# Patient Record
Sex: Male | Born: 1965 | Race: White | Hispanic: No | Marital: Single | Smoking: Never smoker
Health system: Southern US, Community
[De-identification: ages and names within clinical notes are randomized; demographics above are authoritative.]

## PROBLEM LIST (undated history)

## (undated) DIAGNOSIS — F419 Anxiety disorder, unspecified: Secondary | ICD-10-CM

## (undated) DIAGNOSIS — F410 Panic disorder [episodic paroxysmal anxiety] without agoraphobia: Secondary | ICD-10-CM

## (undated) DIAGNOSIS — Z9889 Other specified postprocedural states: Secondary | ICD-10-CM

## (undated) DIAGNOSIS — G4710 Hypersomnia, unspecified: Secondary | ICD-10-CM

## (undated) HISTORY — DX: Hypersomnia, unspecified: G47.10

## (undated) HISTORY — PX: CHOLECYSTECTOMY: SHX55

---

## 1998-05-04 ENCOUNTER — Emergency Department (HOSPITAL_COMMUNITY): Admission: EM | Admit: 1998-05-04 | Discharge: 1998-05-04 | Payer: Self-pay | Admitting: Emergency Medicine

## 1998-08-12 ENCOUNTER — Emergency Department (HOSPITAL_COMMUNITY): Admission: EM | Admit: 1998-08-12 | Discharge: 1998-08-12 | Payer: Self-pay | Admitting: Emergency Medicine

## 1998-08-12 ENCOUNTER — Encounter: Payer: Self-pay | Admitting: Emergency Medicine

## 1998-08-13 ENCOUNTER — Encounter: Payer: Self-pay | Admitting: Emergency Medicine

## 1998-08-15 ENCOUNTER — Emergency Department (HOSPITAL_COMMUNITY): Admission: EM | Admit: 1998-08-15 | Discharge: 1998-08-15 | Payer: Self-pay | Admitting: Emergency Medicine

## 1998-08-31 ENCOUNTER — Encounter: Payer: Self-pay | Admitting: Emergency Medicine

## 1998-08-31 ENCOUNTER — Emergency Department (HOSPITAL_COMMUNITY): Admission: EM | Admit: 1998-08-31 | Discharge: 1998-08-31 | Payer: Self-pay | Admitting: Emergency Medicine

## 1998-09-10 ENCOUNTER — Emergency Department (HOSPITAL_COMMUNITY): Admission: EM | Admit: 1998-09-10 | Discharge: 1998-09-10 | Payer: Self-pay | Admitting: Emergency Medicine

## 1998-09-13 ENCOUNTER — Emergency Department (HOSPITAL_COMMUNITY): Admission: EM | Admit: 1998-09-13 | Discharge: 1998-09-13 | Payer: Self-pay | Admitting: Emergency Medicine

## 1998-09-21 ENCOUNTER — Encounter: Payer: Self-pay | Admitting: Emergency Medicine

## 1998-09-21 ENCOUNTER — Emergency Department (HOSPITAL_COMMUNITY): Admission: EM | Admit: 1998-09-21 | Discharge: 1998-09-21 | Payer: Self-pay | Admitting: Emergency Medicine

## 1998-09-24 ENCOUNTER — Emergency Department (HOSPITAL_COMMUNITY): Admission: EM | Admit: 1998-09-24 | Discharge: 1998-09-25 | Payer: Self-pay | Admitting: Emergency Medicine

## 1998-10-01 ENCOUNTER — Inpatient Hospital Stay (HOSPITAL_COMMUNITY): Admission: EM | Admit: 1998-10-01 | Discharge: 1998-10-04 | Payer: Self-pay | Admitting: Cardiovascular Disease

## 1998-10-26 ENCOUNTER — Emergency Department (HOSPITAL_COMMUNITY): Admission: EM | Admit: 1998-10-26 | Discharge: 1998-10-26 | Payer: Self-pay | Admitting: Emergency Medicine

## 1998-10-26 ENCOUNTER — Encounter: Payer: Self-pay | Admitting: Emergency Medicine

## 1998-11-27 ENCOUNTER — Emergency Department (HOSPITAL_COMMUNITY): Admission: EM | Admit: 1998-11-27 | Discharge: 1998-11-27 | Payer: Self-pay | Admitting: Emergency Medicine

## 1998-12-03 ENCOUNTER — Emergency Department (HOSPITAL_COMMUNITY): Admission: EM | Admit: 1998-12-03 | Discharge: 1998-12-03 | Payer: Self-pay | Admitting: Emergency Medicine

## 2000-04-24 ENCOUNTER — Emergency Department (HOSPITAL_COMMUNITY): Admission: EM | Admit: 2000-04-24 | Discharge: 2000-04-24 | Payer: Self-pay | Admitting: Emergency Medicine

## 2002-09-04 ENCOUNTER — Encounter: Payer: Self-pay | Admitting: Family Medicine

## 2002-09-04 ENCOUNTER — Ambulatory Visit (HOSPITAL_COMMUNITY): Admission: RE | Admit: 2002-09-04 | Discharge: 2002-09-04 | Payer: Self-pay | Admitting: Family Medicine

## 2003-08-15 ENCOUNTER — Emergency Department (HOSPITAL_COMMUNITY): Admission: EM | Admit: 2003-08-15 | Discharge: 2003-08-16 | Payer: Self-pay | Admitting: Emergency Medicine

## 2009-08-26 ENCOUNTER — Emergency Department (HOSPITAL_COMMUNITY): Admission: EM | Admit: 2009-08-26 | Discharge: 2009-08-27 | Payer: Self-pay | Admitting: Emergency Medicine

## 2009-09-02 ENCOUNTER — Ambulatory Visit (HOSPITAL_COMMUNITY): Admission: RE | Admit: 2009-09-02 | Discharge: 2009-09-02 | Payer: Self-pay | Admitting: Cardiology

## 2010-01-27 ENCOUNTER — Encounter: Admission: RE | Admit: 2010-01-27 | Discharge: 2010-01-27 | Payer: Self-pay | Admitting: Family Medicine

## 2010-10-26 ENCOUNTER — Emergency Department (HOSPITAL_COMMUNITY)
Admission: EM | Admit: 2010-10-26 | Discharge: 2010-10-26 | Disposition: A | Discharge status: Home / Self Care | Payer: Self-pay | Attending: Emergency Medicine | Admitting: Emergency Medicine

## 2010-10-26 DIAGNOSIS — H5710 Ocular pain, unspecified eye: Secondary | ICD-10-CM | POA: Insufficient documentation

## 2010-10-26 DIAGNOSIS — H16139 Photokeratitis, unspecified eye: Secondary | ICD-10-CM | POA: Insufficient documentation

## 2010-12-26 LAB — POCT CARDIAC MARKERS
CKMB, poc: <1 ng/mL — ABNORMAL LOW (ref 1.0–8.0)
CKMB, poc: <1 ng/mL — ABNORMAL LOW (ref 1.0–8.0)
Myoglobin, poc: 53.3 ng/mL (ref 12–200)
Troponin i, poc: <0.05 ng/mL (ref 0.00–0.09)

## 2010-12-26 LAB — BASIC METABOLIC PANEL
Calcium: 9 mg/dL (ref 8.4–10.5)
GFR calc non Af Amer: >60 mL/min (ref >60)
Potassium: 3.6 mEq/L (ref 3.5–5.1)
Sodium: 140 mEq/L (ref 135–145)

## 2010-12-26 LAB — CBC
HCT: 42.9 % (ref 39.0–52.0)
Hemoglobin: 14.7 g/dL (ref 13.0–17.0)
Platelets: 218 10*3/uL (ref 150–400)
WBC: 5.9 10*3/uL (ref 4.0–10.5)

## 2012-12-09 ENCOUNTER — Other Ambulatory Visit: Payer: Self-pay

## 2012-12-09 ENCOUNTER — Emergency Department (HOSPITAL_COMMUNITY)
Admission: EM | Admit: 2012-12-09 | Discharge: 2012-12-09 | Disposition: A | Discharge status: Home / Self Care | Payer: BC Managed Care – PPO | Attending: Emergency Medicine | Admitting: Emergency Medicine

## 2012-12-09 ENCOUNTER — Encounter (HOSPITAL_COMMUNITY): Payer: Self-pay | Admitting: Emergency Medicine

## 2012-12-09 ENCOUNTER — Emergency Department (HOSPITAL_COMMUNITY): Payer: BC Managed Care – PPO

## 2012-12-09 DIAGNOSIS — F419 Anxiety disorder, unspecified: Secondary | ICD-10-CM

## 2012-12-09 DIAGNOSIS — Z79899 Other long term (current) drug therapy: Secondary | ICD-10-CM | POA: Insufficient documentation

## 2012-12-09 DIAGNOSIS — F411 Generalized anxiety disorder: Secondary | ICD-10-CM | POA: Insufficient documentation

## 2012-12-09 DIAGNOSIS — I252 Old myocardial infarction: Secondary | ICD-10-CM | POA: Insufficient documentation

## 2012-12-09 DIAGNOSIS — Z9861 Coronary angioplasty status: Secondary | ICD-10-CM | POA: Insufficient documentation

## 2012-12-09 DIAGNOSIS — R079 Chest pain, unspecified: Secondary | ICD-10-CM

## 2012-12-09 DIAGNOSIS — R110 Nausea: Secondary | ICD-10-CM | POA: Insufficient documentation

## 2012-12-09 DIAGNOSIS — Z951 Presence of aortocoronary bypass graft: Secondary | ICD-10-CM | POA: Insufficient documentation

## 2012-12-09 DIAGNOSIS — R0602 Shortness of breath: Secondary | ICD-10-CM | POA: Insufficient documentation

## 2012-12-09 HISTORY — DX: Panic disorder (episodic paroxysmal anxiety): F41.0

## 2012-12-09 HISTORY — DX: Anxiety disorder, unspecified: F41.9

## 2012-12-09 HISTORY — DX: Other specified postprocedural states: Z98.890

## 2012-12-09 LAB — CBC WITH DIFFERENTIAL/PLATELET
Basophils Absolute: 0 10*3/uL (ref 0.0–0.1)
Eosinophils Absolute: 0.2 10*3/uL (ref 0.0–0.7)
Lymphs Abs: 1.2 10*3/uL (ref 0.7–4.0)
MCH: 29.5 pg (ref 26.0–34.0)
Neutrophils Relative %: 75 % (ref 43–77)
Platelets: 224 10*3/uL (ref 150–400)
RBC: 5.36 MIL/uL (ref 4.22–5.81)
RDW: 13.5 % (ref 11.5–15.5)
WBC: 7.5 10*3/uL (ref 4.0–10.5)

## 2012-12-09 LAB — BASIC METABOLIC PANEL
Calcium: 9.8 mg/dL (ref 8.4–10.5)
GFR calc Af Amer: >90 mL/min (ref >90)
GFR calc non Af Amer: >90 mL/min (ref >90)
Glucose, Bld: 114 mg/dL — ABNORMAL HIGH (ref 70–99)
Potassium: 3.8 mEq/L (ref 3.5–5.1)
Sodium: 140 mEq/L (ref 135–145)

## 2012-12-09 LAB — TROPONIN I: Troponin I: <0.3 ng/mL (ref <0.30)

## 2012-12-09 LAB — HEPATIC FUNCTION PANEL
AST: 19 U/L (ref 0–37)
Bilirubin, Direct: 0.1 mg/dL (ref 0.0–0.3)
Indirect Bilirubin: 0.4 mg/dL (ref 0.3–0.9)
Total Bilirubin: 0.5 mg/dL (ref 0.3–1.2)

## 2012-12-09 LAB — LIPASE, BLOOD: Lipase: 37 U/L (ref 11–59)

## 2012-12-09 LAB — D-DIMER, QUANTITATIVE: D-Dimer, Quant: <0.27 ug/mL-FEU (ref 0.00–0.48)

## 2012-12-09 MED ORDER — ONDANSETRON HCL 4 MG/2ML IJ SOLN
4.0000 mg | Freq: Once | INTRAMUSCULAR | Status: AC
  Administered 2012-12-09: 4 mg via INTRAVENOUS
  Filled 2012-12-09: qty 2

## 2012-12-09 NOTE — ED Notes (Signed)
Pt states that 45 minutes ago he woke up with difficulty breathing and chest pain/pressure. States that he also feels light headed and dizzy. Pt also notes a history of panic attacks but is not sure if this is the same thing.

## 2012-12-09 NOTE — ED Provider Notes (Signed)
History     CSN: [id number]  Arrival date & time 12/09/12  0423   First MD Initiated Contact with Patient 12/09/12 [phone]      Chief Complaint  Patient presents with  . Shortness of Breath  . Chest Pain    (Consider location/radiation/quality/duration/timing/severity/associated sxs/prior treatment) HPI Comments: Pt has hx of anxiety and panic attacks - has had 2 normal heart cath's first in 2000, then second in 2010, neither of which he reports to show any blockages.  He has no htn, no dm, no cholesterolemia and no smoking / etoh use.  He does have brother with MI at early age requiring CABG.  The pt was awakened from bed this evening with feeling of panic and anxiety - CP, SOB and nausea with dry heaving.  He reports being off his regular anxiety meds over the last month - taking paroxetine and Klonopin as needed only.  He still has mild sx at this time including nausea but improving.  He denies fevers, cough, back pain, swelling or rashes.  No ha, blurred vision.  He has had post prandial discomfort in his upper abd for some time.  He had GB surgery in the past and no hx of pancreatitis.  He also notes that he works long distances away and drives 5+ hours frequently.  When he was having onset of sx tonight - he reports high HR in the 120's.  Patient is a 47 y.o. male presenting with shortness of breath and chest pain. The history is provided by the patient.  Shortness of Breath Associated symptoms: chest pain   Chest Pain Associated symptoms: shortness of breath     Past Medical History  Diagnosis Date  . Anxiety   . Panic attacks   . Hx of cardiac cath     x2, no stents     Past Surgical History  Procedure Laterality Date  . Cholecystectomy      History reviewed. No pertinent family history.  History  Substance Use Topics  . Smoking status: Never Smoker   . Smokeless tobacco: Not on file  . Alcohol Use: No      Review of Systems  Respiratory: Positive for shortness  of breath.   Cardiovascular: Positive for chest pain.  All other systems reviewed and are negative.    Allergies  Review of patient's allergies indicates no known allergies.  Home Medications   Current Outpatient Rx  Name  Route  Sig  Dispense  Refill  . clonazePAM (KLONOPIN) 1 MG tablet   Oral   Take 1 mg by mouth 2 (two) times daily as needed for anxiety.         Marland Kitchen PARoxetine (PAXIL) 20 MG tablet   Oral   Take 40 mg by mouth every morning.           BP 127/63  Pulse 101  Temp(Src) 98 F (36.7 C) (Oral)  Resp 22  SpO2 99%  Physical Exam  Nursing note and vitals reviewed. Constitutional: He appears well-developed and well-nourished. No distress.  HENT:  Head: Normocephalic and atraumatic.  Mouth/Throat: Oropharynx is clear and moist. No oropharyngeal exudate.  Eyes: Conjunctivae and EOM are normal. Pupils are equal, round, and reactive to light. Right eye exhibits no discharge. Left eye exhibits no discharge. No scleral icterus.  Neck: Normal range of motion. Neck supple. No JVD present. No thyromegaly present.  Cardiovascular: Normal rate, regular rhythm, normal heart sounds and intact distal pulses.  Exam reveals no gallop and  no friction rub.   No murmur heard. Pulmonary/Chest: Effort normal and breath sounds normal. No respiratory distress. He has no wheezes. He has no rales.  Abdominal: Soft. Bowel sounds are normal. He exhibits no distension and no mass. There is no tenderness.  Musculoskeletal: Normal range of motion. He exhibits no edema and no tenderness.  Lymphadenopathy:    He has no cervical adenopathy.  Neurological: He is alert. Coordination normal.  Skin: Skin is warm and dry. No rash noted. No erythema.  Psychiatric: He has a normal mood and affect. His behavior is normal.    ED Course  Procedures (including critical care time)  Labs Reviewed  BASIC METABOLIC PANEL - Abnormal; Notable for the following:    Glucose, Bld 114 (*)    All other  components within normal limits  CBC WITH DIFFERENTIAL  TROPONIN I  D-DIMER, QUANTITATIVE  LIPASE, BLOOD  HEPATIC FUNCTION PANEL   Dg Chest 2 View  12/09/2012  *RADIOLOGY REPORT*  Clinical Data: Chest pain and shortness of breath.  CHEST - 2 VIEW  Comparison: 08/26/2009  Findings: The heart size and pulmonary vascularity are normal. The lungs appear clear and expanded without focal air space disease or consolidation. No blunting of the costophrenic angles. Pneumothorax.  Mediastinal contours appear intact.  No significant change since previous study.  IMPRESSION: No evidence of active pulmonary disease.   Original Report Authenticated By: Burman Nieves, M.D.      1. Chest pain   2. Shortness of breath   3. Anxiety       MDM  On my exam , the pt has no signs of DVT - he does have a HR which is > 90 and travels - will obtain d dimer to evaluate for PE, otherwise he is very low risk for ACS, ECG non ischemic and he has lipase and LFT's pending.  Declines pain meds, requests nausea meds.  ED ECG REPORT  I personally interpreted this EKG   Date: 12/09/2012   Rate: 99  Rhythm: normal sinus rhythm  QRS Axis: normal  Intervals: normal  ST/T Wave abnormalities: normal  Conduction Disutrbances:none  Narrative Interpretation:   Old EKG Reviewed: none available  Labs and xray normal - no signs of cardiac or pulmonary disease clinically or on testing.  Pt appears stable for f/u as outpatient - recommended daily meds especially with Paxil.  Pt in agreement as he now states he has been having more anxiety attacks lately.       Vida Roller, MD 12/09/12 [phone]

## 2013-08-17 ENCOUNTER — Encounter (INDEPENDENT_AMBULATORY_CARE_PROVIDER_SITE_OTHER): Payer: Self-pay

## 2013-08-17 ENCOUNTER — Ambulatory Visit (INDEPENDENT_AMBULATORY_CARE_PROVIDER_SITE_OTHER): Payer: BC Managed Care – PPO | Admitting: Neurology

## 2013-08-17 ENCOUNTER — Telehealth: Payer: Self-pay | Admitting: Neurology

## 2013-08-17 ENCOUNTER — Encounter: Payer: Self-pay | Admitting: Neurology

## 2013-08-17 VITALS — BP 127/83 | HR 67 | Resp 16 | Ht 69.0 in | Wt 240.0 lb

## 2013-08-17 DIAGNOSIS — G4726 Circadian rhythm sleep disorder, shift work type: Secondary | ICD-10-CM

## 2013-08-17 DIAGNOSIS — R0609 Other forms of dyspnea: Secondary | ICD-10-CM

## 2013-08-17 DIAGNOSIS — R0683 Snoring: Secondary | ICD-10-CM

## 2013-08-17 DIAGNOSIS — G2581 Restless legs syndrome: Secondary | ICD-10-CM

## 2013-08-17 DIAGNOSIS — G4710 Hypersomnia, unspecified: Secondary | ICD-10-CM

## 2013-08-17 DIAGNOSIS — G4759 Other parasomnia: Secondary | ICD-10-CM

## 2013-08-17 DIAGNOSIS — G4752 REM sleep behavior disorder: Secondary | ICD-10-CM

## 2013-08-17 MED ORDER — ARMODAFINIL 150 MG PO TABS: 150.0000 mg | ORAL_TABLET | Freq: Every day | ORAL | Status: DC

## 2013-08-17 NOTE — Telephone Encounter (Signed)
Pt has decided to wait until the beginning of the year before scheduling his sleep study.  He has not applied anything towards his deductible for the current year so he has chosen to wait until his new benefit period as he may potentially have new insurance.  He will contact our office at that time to follow up.

## 2013-08-17 NOTE — Progress Notes (Signed)
[facility]  Provider:  Melvyn Novas, M D  Referring Provider: Alice Reichert, MD Primary Care Physician:  Alice Reichert, MD  Chief Complaint  Patient presents with  . Sleep Consult    NP Virgina [place]    HPI:  Aaron Rosario is a 47 y.o. caucasian , right handed  male  is seen here as a referral   from NP Ronnell Freshwater at Dr. Rocky Morel Kinney's office.    Aaron Rosario , [name], right-handed male, has reported to his NP over the last several visits that he has  extreme daytime Sleepiness.  As an example he today endorsed the Epworth sleepiness score at 20 points , his  fatigue severity score is elevated  at 34 points.  He states that he has rarely trouble falling asleep,  sometimes he may have trouble staying asleep at night. Insomnia is not one of his main concerns.  A past medical history of only gastroesophageal reflux was mentioned,  and a significant weight gain over the last 5 years was noted . He has been hypersomnic for 2-3 years now. There was no related medication changes and no medical diagnosis or trauma at the time.   He works rather irregular hours, is gainfully employed.  He Psychologist, sport and exercise for [employer]  He would routinely work 8 AM  to 5 PM but his work is often dependent on his" client's' needs and may take him well  into the night. He is off work on weekends.   Normal sleep time routine  is endorsed as a bed time at 10-11 PM , he  falls asleep promptly, he  rises at 6.30 "to let the dogs out"  and goes on that occasion to the bathroom break, he will go back to sleep, and sleep until 8.30 to 9.30. On weekends it may be Noon time . He will rarely drink coffee.  He will get over 7 hours of sleep but wakes up un-refreshed, spontaneously , he has an alarm for aback up. He takes nightly clonazepam and if not will wake up "startled and choked". He is known to sleep talk and tosses and turns a lot.  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  He will not nap but he fights sleep all through the day.  He seeks, needs stimulation, and thus he may drive with the window down .  If he is a passenger , he will fall asleep every time .  No naps once home. He will do his chores, and while watching TV will sometimes fall asleep. Interestingly he experiences  the urge to sleep much less at home when relaxed and able to sleep.      Family history of sleep disorders  is not known. No OSA, seizures, night terrors. Insomnia.  No personal  history of trauma or surgery to skull, neck and face , airway.    Review of Systems: Out of a complete 14 system review, the patient complains of only the following symptoms, and all other reviewed systems are negative:   the patient endorsed weight gain and tendency to bruise easily. fatigue, snoring, joint pain muscle cramps and muscle aching He also feels at times generalized weak and  dizzy -  restless legs and snoring, memory loss or at least concentration problems, anxiety and a feeling of decreased energy in spite of prolonged sleep.  His history of anxiety with some  panic attacks .    Only surgery was a cholecystectomy in 2000.    History   Social History  . Marital Status: Single    Spouse Name: N/A    Number of Children: 0  . Years of Education: 13   Occupational History  .      [employer] officer   Social History Main Topics  . Smoking status: Never Smoker   . Smokeless tobacco: Not on file  . Alcohol Use: No     Comment: Caffeine user 2-3 sodas a day  . Drug Use: No  . Sexual Activity: Not on file   Other Topics Concern  . Not on file   Social History Narrative  . No narrative on file    History reviewed. No pertinent family history.  Past Medical History  Diagnosis Date  . Anxiety   . Panic attacks   . Hx of cardiac cath     x2, no stents   . Hypersomnia, persistent     Past Surgical History   Procedure Laterality Date  . Cholecystectomy      Current Outpatient Prescriptions  Medication Sig Dispense Refill  . clonazePAM (KLONOPIN) 1 MG tablet Take 1 mg by mouth 2 (two) times daily as needed for anxiety.      Marland Kitchen PARoxetine (PAXIL) 20 MG tablet Take 40 mg by mouth every morning.       No current facility-administered medications for this visit.    Allergies as of 08/17/2013  . (No Known Allergies)    Vitals: BP 127/83  Pulse 67  Resp 16  Ht 5\' 9"  (1.753 m)  Wt 240 lb (108.863 kg)  BMI 35.43 kg/m2 Last Weight:  Wt Readings from Last 1 Encounters:  08/17/13 240 lb (108.863 kg)   Last Height:   Ht Readings from Last 1 Encounters:  08/17/13 5\' 9"  (1.753 m)    Physical exam:  General: The patient is awake, alert and appears not in acute distress. The patient is well groomed. Head: Normocephalic, atraumatic. Neck is supple. Mallampati 4, neck circumference:16.5  Cardiovascular:  Regular rate and rhythm , without  murmurs or carotid bruit, and without distended neck veins. Respiratory: Lungs are clear to auscultation. Skin:  Without evidence of edema, or rash, joint pain reported. Trunk: BMI is  elevated and patient  has normal posture.  Neurologic exam : The patient is awake and alert, oriented to place and time.  Memory subjective  described as intact. There is a restricted attention span & concentration ability when tired.  Speech is fluent without  dysarthria, dysphonia or aphasia. Mood and affect are appropriate.  Cranial nerves: Pupils are equal and briskly reactive to light. Funduscopic exam without  evidence of pallor or edema. Extraocular movements  in vertical and horizontal planes intact and without nystagmus. Visual fields by finger perimetry are intact. Hearing to finger rub intact.  Facial sensation intact to fine touch. Facial motor strength is symmetric and tongue and uvula move midline.  Motor exam:   Normal tone and normal muscle bulk and  symmetric normal strength in all extremities. He is fidgety.   Sensory:  Fine touch, pinprick and vibration were tested in all extremities. Proprioception is tested in the upper extremities only. This was normal.  Coordination: Rapid alternating movements in the fingers/hands is tested and normal.  Finger-to-nose maneuver tested and normal without evidence of ataxia, dysmetria or tremor.  Gait and station: Patient walks without assistive device .  Strength within normal limits. Stance  is stable and normal. Tandem gait is unfragmented. Romberg testing is  normal.  Deep tendon reflexes: in the  upper and lower extremities are symmetric and intact. Babinski maneuver response is  downgoing.   Assessment:  After physical and neurologic examination, review of laboratory studies, imaging, neurophysiology testing and pre-existing records, assessment is  0) EDS with Epworth of 20 and FSS of 34.  Reports neither sleep paralysis, nor cataplectic attacks. No dream intrusion.  1) restlessness, twitching and fidgety . Mostly when trying to fall asleep.  2) snoring, witnessed as loud with choking interruptions .  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  Plan:  Treatment plan and additional workup : SPLIT study for apnea work up, based on medications take by this patient I would like an HLA narcolepsy test rather than MSLT (medication  weaning process would be prolonged for meaningful, valid MSLT). Patient will  take Klonopin here in the sleep lab only if he wakes up, not to initiate sleep if avoidable.   Recommendations for referring provider.  Consider weaning off Paxil and using Neurontin for RLS.  Wellbutrin may help with morning fatigue.

## 2013-08-17 NOTE — Patient Instructions (Signed)
Polysomnography (Sleep Studies) Polysomnography (PSG) is a series of tests used for detecting (diagnosing) obstructive sleep apnea and other sleep disorders. The tests measure how some parts of your body are working while you are sleeping. The tests are extensive and expensive. They are done in a sleep lab or hospital, and vary from center to center. Your caregiver may perform other more simple sleep studies and questionnaires before doing more complete and involved testing. Testing may not be covered by insurance. Some of these tests are:  An EEG (Electroencephalogram). This tests your brain waves and stages of sleep.  An EOG (Electrooculogram). This measures the movements of your eyes. It detects periods of REM (rapid eye movement) sleep, which is your dream sleep.  An EKG (Electrocardiogram). This measures your heart rhythm.  EMG (Electromyography). This is a measurement of how the muscles are working in your upper airway and your legs while sleeping.  An oximetry measurement. It measures how much oxygen (air) you are getting while sleeping.  Breathing efforts may be measured. The same test can be interpreted (understood) differently by different caregivers and centers that study sleep.  Studies may be given an apnea/hypopnea index (AHI). This is a number which is found by counting the times of no breathing or under breathing during the night, and relating those numbers to the amount of time spent in bed. When the AHI is greater than 15, the patient is likely to complain of daytime sleepiness. When the AHI is greater than 30, the patient is at increased risk for heart problems and must be followed more closely. Following the AHI also allows you to know how treatment is working. Simple oximetry (tracking the amount of oxygen that is taken in) can be used for screening patients who:  Do not have symptoms (problems) of OSA.  Have a normal Epworth Sleepiness Scale Score.  Have a low pre-test  probability of having OSA.  Have none of the upper airway problems likely to cause apnea.  Oximetry is also used to determine if treatment is effective in patients who showed significant desaturations (not getting enough oxygen) on their home sleep study. One extra measure of safety is to perform additional studies for the person who only snores. This is because no one can predict with absolute certainty who will have OSA. Those who show significant desaturations (not getting enough oxygen) are recommended to have a more detailed sleep study. Document Released: 03/17/2003 Document Revised: 12/03/2011 Document Reviewed: 09/10/2005 [facility] Patient Information 2014 [place], [place]. Exercise to Lose Weight Exercise and a healthy diet may help you lose weight. Your doctor may suggest specific exercises. EXERCISE IDEAS AND TIPS  Choose low-cost things you enjoy doing, such as walking, bicycling, or exercising to workout videos.  Take stairs instead of the elevator.  Walk during your lunch break.  Park your car further away from work or school.  Go to a gym or an exercise class.  Start with 5 to 10 minutes of exercise each day. Build up to 30 minutes of exercise 4 to 6 days a week.  Wear shoes with good support and comfortable clothes.  Stretch before and after working out.  Work out until you breathe harder and your heart beats faster.  Drink extra water when you exercise.  Do not do so much that you hurt yourself, feel dizzy, or get very short of breath. Exercises that burn about 150 calories:  Running 1  miles in 15 minutes.  Playing volleyball for 45 to 60 minutes.  Washing and waxing a car for 45 to 60 minutes.  Playing touch football for 45 minutes.  Walking 1  miles in 35 minutes.  Pushing a stroller 1  miles in 30 minutes.  Playing basketball for 30 minutes.  Raking leaves for 30 minutes.  Bicycling 5 miles in 30 minutes.  Walking 2 miles in 30  minutes.  Dancing for 30 minutes.  Shoveling snow for 15 minutes.  Swimming laps for 20 minutes.  Walking up stairs for 15 minutes.  Bicycling 4 miles in 15 minutes.  Gardening for 30 to 45 minutes.  Jumping rope for 15 minutes.  Washing windows or floors for 45 to 60 minutes. Document Released: 10/13/2010 Document Revised: 12/03/2011 Document Reviewed: 10/13/2010 [facility] Patient Information 2014 [place], [place]. Obesity Obesity is having too much body fat and a body mass index (BMI) of 30 or more. BMI is a number based on your height and weight. The number is an estimate of how much body fat you have. Obesity can happen if you eat more calories than you can burn by exercising or other activity. It can cause major health problems or emergencies.  HOME CARE  Exercise and be active as told by your doctor. Try:  Using stairs when you can.  Parking farther away from store doors.  Gardening, biking, or walking.  Eat healthy foods and drinks that are low in calories. Eat more fruits and vegetables.  Limit fast food, sweets, and snack foods that are made with ingredients that are not natural (processed food).  Eat smaller amounts of food.  Keep a journal and write down what you eat every day. Websites can help with this.  Avoid drinking alcohol. Drink more water and drinks without calories.   Take vitamins and dietary pills (supplements) only as told by your doctor.  Try going to weight-loss support groups or classes to help lessen stress. Dieticians and counselors may also help. GET HELP RIGHT AWAY IF:  You have chest pain or tightness.  You have trouble breathing or feel short of breath.  You feel weak or have loss of feeling (numbness) in your legs.  You feel confused or have trouble talking.  You have sudden changes in your vision. MAKE SURE YOU:  Understand these instructions.  Will watch your condition.  Will get help right away if you are not doing well  or get worse. Document Released: 12/03/2011 Document Reviewed: 12/03/2011 [facility] Patient Information 2014 [place], [place].

## 2013-08-18 NOTE — Progress Notes (Signed)
Patient here seeing Dr. Vickey Huger.  Order for lab for HLA typing.  Lab draw from left [facility] with 23g butterfly successful.  Patient to checkout in NAD.

## 2013-09-04 ENCOUNTER — Telehealth: Payer: Self-pay | Admitting: Neurology

## 2013-09-04 NOTE — Telephone Encounter (Signed)
Spoke to patient and relayed negative result for HLA typing for narcolepsy.  He will have his sleep study next year due to insurance.

## 2014-04-05 ENCOUNTER — Encounter (HOSPITAL_COMMUNITY): Payer: Self-pay | Admitting: Emergency Medicine

## 2014-04-05 ENCOUNTER — Emergency Department (HOSPITAL_COMMUNITY): Payer: BC Managed Care – PPO

## 2014-04-05 ENCOUNTER — Emergency Department (HOSPITAL_COMMUNITY)
Admission: EM | Admit: 2014-04-05 | Discharge: 2014-04-05 | Disposition: A | Discharge status: Home / Self Care | Payer: BC Managed Care – PPO | Attending: Emergency Medicine | Admitting: Emergency Medicine

## 2014-04-05 DIAGNOSIS — F410 Panic disorder [episodic paroxysmal anxiety] without agoraphobia: Secondary | ICD-10-CM | POA: Insufficient documentation

## 2014-04-05 DIAGNOSIS — Z9889 Other specified postprocedural states: Secondary | ICD-10-CM | POA: Insufficient documentation

## 2014-04-05 DIAGNOSIS — R0789 Other chest pain: Secondary | ICD-10-CM | POA: Insufficient documentation

## 2014-04-05 DIAGNOSIS — R079 Chest pain, unspecified: Secondary | ICD-10-CM

## 2014-04-05 LAB — BASIC METABOLIC PANEL
ANION GAP: 15 (ref 5–15)
BUN: 9 mg/dL (ref 6–23)
CO2: 22 mEq/L (ref 19–32)
CREATININE: 1.05 mg/dL (ref 0.50–1.35)
Calcium: 9.5 mg/dL (ref 8.4–10.5)
Chloride: 103 mEq/L (ref 96–112)
GFR calc Af Amer: >90 mL/min (ref >90)
GFR, EST NON AFRICAN AMERICAN: 84 mL/min — AB (ref >90)
Glucose, Bld: 101 mg/dL — ABNORMAL HIGH (ref 70–99)
Potassium: 3.6 mEq/L — ABNORMAL LOW (ref 3.7–5.3)
Sodium: 140 mEq/L (ref 137–147)

## 2014-04-05 LAB — I-STAT TROPONIN, ED
TROPONIN I, POC: 0.01 ng/mL (ref 0.00–0.08)
Troponin i, poc: 0 ng/mL (ref 0.00–0.08)

## 2014-04-05 LAB — CBC
HEMATOCRIT: 45.5 % (ref 39.0–52.0)
Hemoglobin: 15.4 g/dL (ref 13.0–17.0)
MCH: 29.5 pg (ref 26.0–34.0)
MCHC: 33.8 g/dL (ref 30.0–36.0)
MCV: 87.2 fL (ref 78.0–100.0)
Platelets: 238 10*3/uL (ref 150–400)
RBC: 5.22 MIL/uL (ref 4.22–5.81)
RDW: 13.4 % (ref 11.5–15.5)
WBC: 7 10*3/uL (ref 4.0–10.5)

## 2014-04-05 LAB — PRO B NATRIURETIC PEPTIDE: PRO B NATRI PEPTIDE: 26.2 pg/mL (ref 0–125)

## 2014-04-05 LAB — TROPONIN I

## 2014-04-05 LAB — D-DIMER, QUANTITATIVE: D-Dimer, Quant: <0.27 ug/mL-FEU (ref 0.00–0.48)

## 2014-04-05 MED ORDER — KETOROLAC TROMETHAMINE 30 MG/ML IJ SOLN
30.0000 mg | Freq: Once | INTRAMUSCULAR | Status: AC
  Administered 2014-04-05: 30 mg via INTRAVENOUS
  Filled 2014-04-05: qty 1

## 2014-04-05 NOTE — ED Notes (Signed)
Pt reports he was working and had sudden onset of 4/10 left sided "stabbing" chest pain at 1500 today. Denies radiation. Pt noted to be diaphoretic, also reports SOB, dizziness, lightheadedness and nausea. AO x4.

## 2014-04-05 NOTE — ED Notes (Signed)
Discharge instructions reviewed with pt. Pt verbalized understanding.   

## 2014-04-05 NOTE — Discharge Instructions (Signed)
If you were given medicines take as directed.  If you are on coumadin or contraceptives realize their levels and effectiveness is altered by many different medicines.  If you have any reaction (rash, tongues swelling, other) to the medicines stop taking and see a physician.   Please follow up as directed and return to the ER or see a physician for new or worsening symptoms.  Thank you. Filed Vitals:   04/05/14 1800 04/05/14 1900 04/05/14 1930 04/05/14 2000  BP: 119/72 141/86 132/75 133/79  Pulse: 86 84 71 78  Temp:      TempSrc:      Resp: 19 26 12 11   SpO2: 97% 96% 98% 100%    Chest Pain (Nonspecific) It is often hard to give a specific diagnosis for the cause of chest pain. There is always a chance that your pain could be related to something serious, such as a heart attack or a blood clot in the lungs. You need to follow up with your health care provider for further evaluation. CAUSES   Heartburn.  Pneumonia or bronchitis.  Anxiety or stress.  Inflammation around your heart (pericarditis) or lung (pleuritis or pleurisy).  A blood clot in the lung.  A collapsed lung (pneumothorax). It can develop suddenly on its own (spontaneous pneumothorax) or from trauma to the chest.  Shingles infection (herpes zoster virus). The chest wall is composed of bones, muscles, and cartilage. Any of these can be the source of the pain.  The bones can be bruised by injury.  The muscles or cartilage can be strained by coughing or overwork.  The cartilage can be affected by inflammation and become sore (costochondritis). DIAGNOSIS  Lab tests or other studies may be needed to find the cause of your pain. Your health care provider may have you take a test called an ambulatory electrocardiogram (ECG). An ECG records your heartbeat patterns over a 24-hour period. You may also have other tests, such as:  Transthoracic echocardiogram (TTE). During echocardiography, sound waves are used to evaluate how  blood flows through your heart.  Transesophageal echocardiogram (TEE).  Cardiac monitoring. This allows your health care provider to monitor your heart rate and rhythm in real time.  Holter monitor. This is a portable device that records your heartbeat and can help diagnose heart arrhythmias. It allows your health care provider to track your heart activity for several days, if needed.  Stress tests by exercise or by giving medicine that makes the heart beat faster. TREATMENT   Treatment depends on what may be causing your chest pain. Treatment may include:  Acid blockers for heartburn.  Anti-inflammatory medicine.  Pain medicine for inflammatory conditions.  Antibiotics if an infection is present.  You may be advised to change lifestyle habits. This includes stopping smoking and avoiding alcohol, caffeine, and chocolate.  You may be advised to keep your head raised (elevated) when sleeping. This reduces the chance of acid going backward from your stomach into your esophagus. Most of the time, nonspecific chest pain will improve within 2-3 days with rest and mild pain medicine.  HOME CARE INSTRUCTIONS   If antibiotics were prescribed, take them as directed. Finish them even if you start to feel better.  For the next few days, avoid physical activities that bring on chest pain. Continue physical activities as directed.  Do not use any tobacco products, including cigarettes, chewing tobacco, or electronic cigarettes.  Avoid drinking alcohol.  Only take medicine as directed by your health care provider.  Follow your health care provider's suggestions for further testing if your chest pain does not go away.  Keep any follow-up appointments you made. If you do not go to an appointment, you could develop lasting (chronic) problems with pain. If there is any problem keeping an appointment, call to reschedule. SEEK MEDICAL CARE IF:   Your chest pain does not go away, even after  treatment.  You have a rash with blisters on your chest.  You have a fever. SEEK IMMEDIATE MEDICAL CARE IF:   You have increased chest pain or pain that spreads to your arm, neck, jaw, back, or abdomen.  You have shortness of breath.  You have an increasing cough, or you cough up blood.  You have severe back or abdominal pain.  You feel nauseous or vomit.  You have severe weakness.  You faint.  You have chills. This is an emergency. Do not wait to see if the pain will go away. Get medical help at once. Call your local emergency services (911 in [place] Do not drive yourself to the hospital. MAKE SURE YOU:   Understand these instructions.  Will watch your condition.  Will get help right away if you are not doing well or get worse. Document Released: 06/20/2005 Document Revised: 09/15/2013 Document Reviewed: 04/15/2008 [facility] Patient Information 2015 [place], [place]. This information is not intended to replace advice given to you by your health care provider. Make sure you discuss any questions you have with your health care provider.

## 2014-04-05 NOTE — ED Notes (Signed)
Patient transported to X-ray 

## 2014-04-05 NOTE — ED Provider Notes (Signed)
CSN: [id number]     Arrival date & time 04/05/14  1544 History   First MD Initiated Contact with Patient 04/05/14 1624     Chief Complaint  Patient presents with  . Chest Pain     (Consider location/radiation/quality/duration/timing/severity/associated sxs/prior Treatment) HPI Comments: 48 year old male with hypersomnia, anxiety presents with sharp chest pain since 2:30 PM today, constant however has improved since. Patient has had intermittent chest pain the past few years and has had cardiac cath 4 years ago which per her report was unremarkable. His brother had coronary artery disease in the 67s and his father died in his 33s from cardiac event. Patient denies other current risks of obesity. Pain is positional in note injuries recently.  Patient denies blood clot history, active cancer, recent major trauma or surgery, unilateral leg swelling/ pain, recent long travel, hemoptysis or oral contraceptives. No exertional or diaphoretic symptoms.   Patient is a 48 y.o. male presenting with chest pain. The history is provided by the patient.  Chest Pain Associated symptoms: no abdominal pain, no back pain, no fever, no headache, no shortness of breath and not vomiting     Past Medical History  Diagnosis Date  . Anxiety   . Panic attacks   . Hx of cardiac cath     x2, no stents   . Hypersomnia, persistent    Past Surgical History  Procedure Laterality Date  . Cholecystectomy     No family history on file. History  Substance Use Topics  . Smoking status: Never Smoker   . Smokeless tobacco: Not on file  . Alcohol Use: No     Comment: Caffeine user 2-3 sodas a day    Review of Systems  Constitutional: Negative for fever and chills.  HENT: Negative for congestion.   Eyes: Negative for visual disturbance.  Respiratory: Negative for shortness of breath.   Cardiovascular: Positive for chest pain. Negative for leg swelling.  Gastrointestinal: Negative for vomiting and abdominal pain.   Genitourinary: Negative for dysuria and flank pain.  Musculoskeletal: Negative for back pain, neck pain and neck stiffness.  Skin: Negative for rash.  Neurological: Negative for light-headedness and headaches.      Allergies  Review of patient's allergies indicates no known allergies.  Home Medications   Prior to Admission medications   Medication Sig Start Date End Date Taking? Authorizing Provider  clonazePAM (KLONOPIN) 1 MG tablet Take 1 mg by mouth 2 (two) times daily as needed for anxiety.   Yes Historical Provider, MD  PARoxetine (PAXIL) 20 MG tablet Take 40 mg by mouth every morning.   Yes Historical Provider, MD   BP 145/85  Pulse 106  Temp(Src) 97.8 F (36.6 C) (Oral)  Resp 24  SpO2 97% Physical Exam  Nursing note and vitals reviewed. Constitutional: He is oriented to person, place, and time. He appears well-developed and well-nourished.  HENT:  Head: Normocephalic and atraumatic.  Eyes: Conjunctivae are normal. Right eye exhibits no discharge. Left eye exhibits no discharge.  Neck: Normal range of motion. Neck supple. No tracheal deviation present.  Cardiovascular: Normal rate, regular rhythm and intact distal pulses.   Pulmonary/Chest: Effort normal and breath sounds normal.  Abdominal: Soft. He exhibits no distension. There is no tenderness. There is no guarding.  Musculoskeletal: He exhibits no edema and no tenderness.  Neurological: He is alert and oriented to person, place, and time.  Skin: Skin is warm. No rash noted.  Psychiatric: He has a normal mood and affect.  ED Course  Procedures (including critical care time) Labs Review Labs Reviewed  BASIC METABOLIC PANEL - Abnormal; Notable for the following:    Potassium 3.6 (*)    Glucose, Bld 101 (*)    GFR calc non Af Amer 84 (*)    All other components within normal limits  CBC  PRO B NATRIURETIC PEPTIDE  TROPONIN I  D-DIMER, QUANTITATIVE  I-STAT TROPOININ, ED    Imaging Review Dg Chest 2  View  04/05/2014   CLINICAL DATA:  Chest pain, shortness of breath  EXAM: CHEST  2 VIEW  COMPARISON:  12/09/2012  FINDINGS: The heart size and mediastinal contours are within normal limits. Both lungs are clear. The visualized skeletal structures are unremarkable. Cholecystectomy clips are noted. Minimal S shaped curvature of the thoracolumbar spine is reidentified.  IMPRESSION: No active cardiopulmonary disease.   Electronically Signed   By: Christiana PellantGretchen  Green M.D.   On: 04/05/2014 16:30     EKG Interpretation   Date/Time:  Monday April 05 2014 15:49:16 EDT Ventricular Rate:  107 PR Interval:  144 QRS Duration: 102 QT Interval:  344 QTC Calculation: 459 R Axis:   -79 Text Interpretation:  Sinus tachycardia Left anterior fascicular block Non  specific changes, poor R wave progression Confirmed by Sallie Maker  MD, Maeby Vankleeck  ([id number]) on 04/05/2014 5:31:12 PM      MDM   Final diagnoses:  Chest pain, unspecified chest pain type   Patient with one risk factor for cardiac presents with atypical positional chest pain. With mild tachycardia initially and sharp left-sided pain d-dimer ordered as patient is low risk. D troponin I discussed close followup with cardiology outpatient as patient's symptoms have resolved without treatment.  Patient has no chest pain on recheck and well-appearing. Delta troponin negative. D-dimer negative and patient low risk for both. Followup outpatient with cardiology discussed. Results and differential diagnosis were discussed with the patient/parent/guardian. Close follow up outpatient was discussed, comfortable with the plan.   Medications  ketorolac (TORADOL) 30 MG/ML injection 30 mg (30 mg Intravenous Given 04/05/14 1814)    Filed Vitals:   04/05/14 1800 04/05/14 1900 04/05/14 1930 04/05/14 2000  BP: 119/72 141/86 132/75 133/79  Pulse: 86 84 71 78  Temp:      TempSrc:      Resp: 19 26 12 11   SpO2: 97% 96% 98% 100%         Enid SkeensJoshua M Aury Scollard, MD 04/05/14  2029

## 2014-09-15 IMAGING — CR DG CHEST 2V
2 series · 2 of 2 positions shown · non-contrast
Comparison: 12/09/2012

CLINICAL DATA: Chest pain, shortness of breath

EXAM:
CHEST  2 VIEW

[w chest pa]
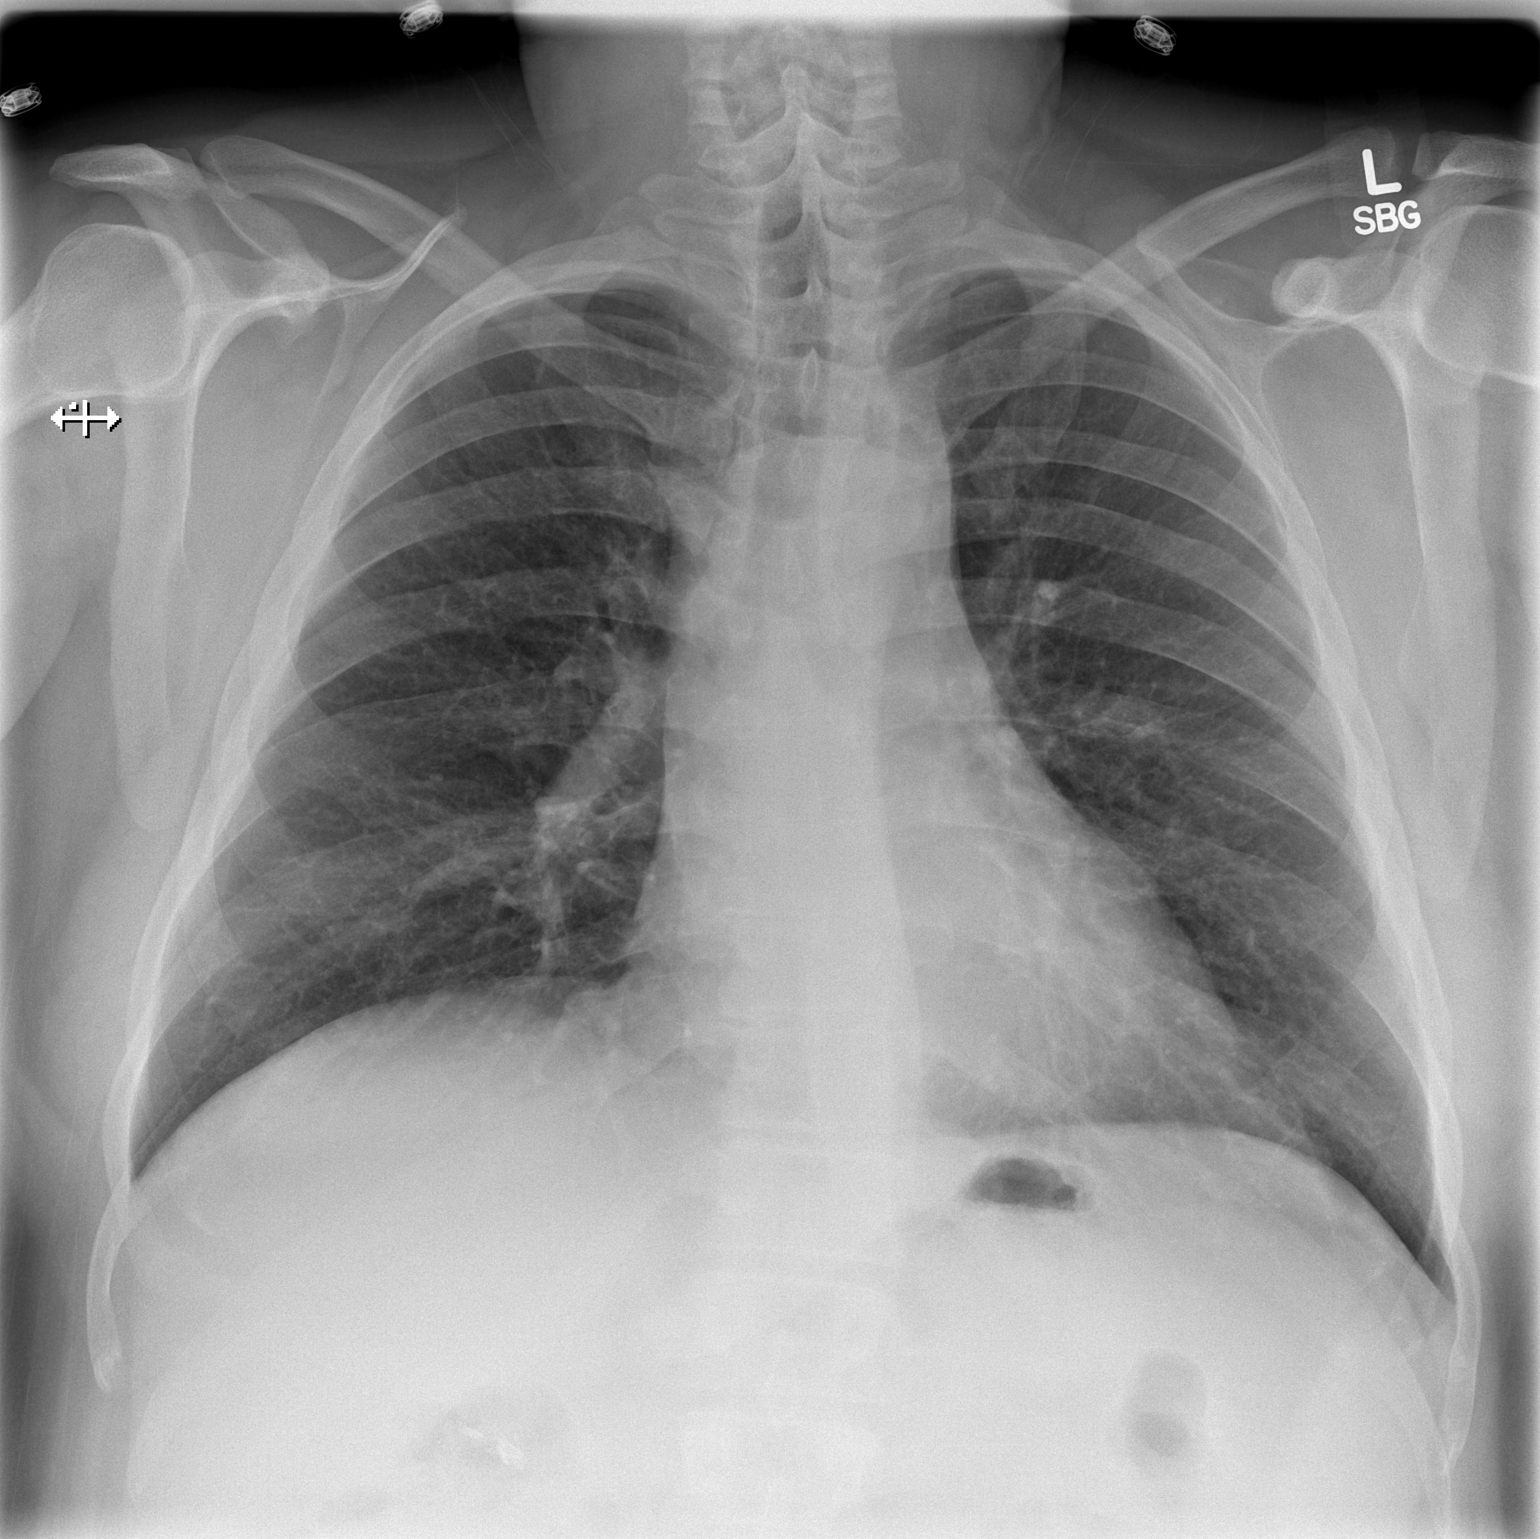

[w chest lat]
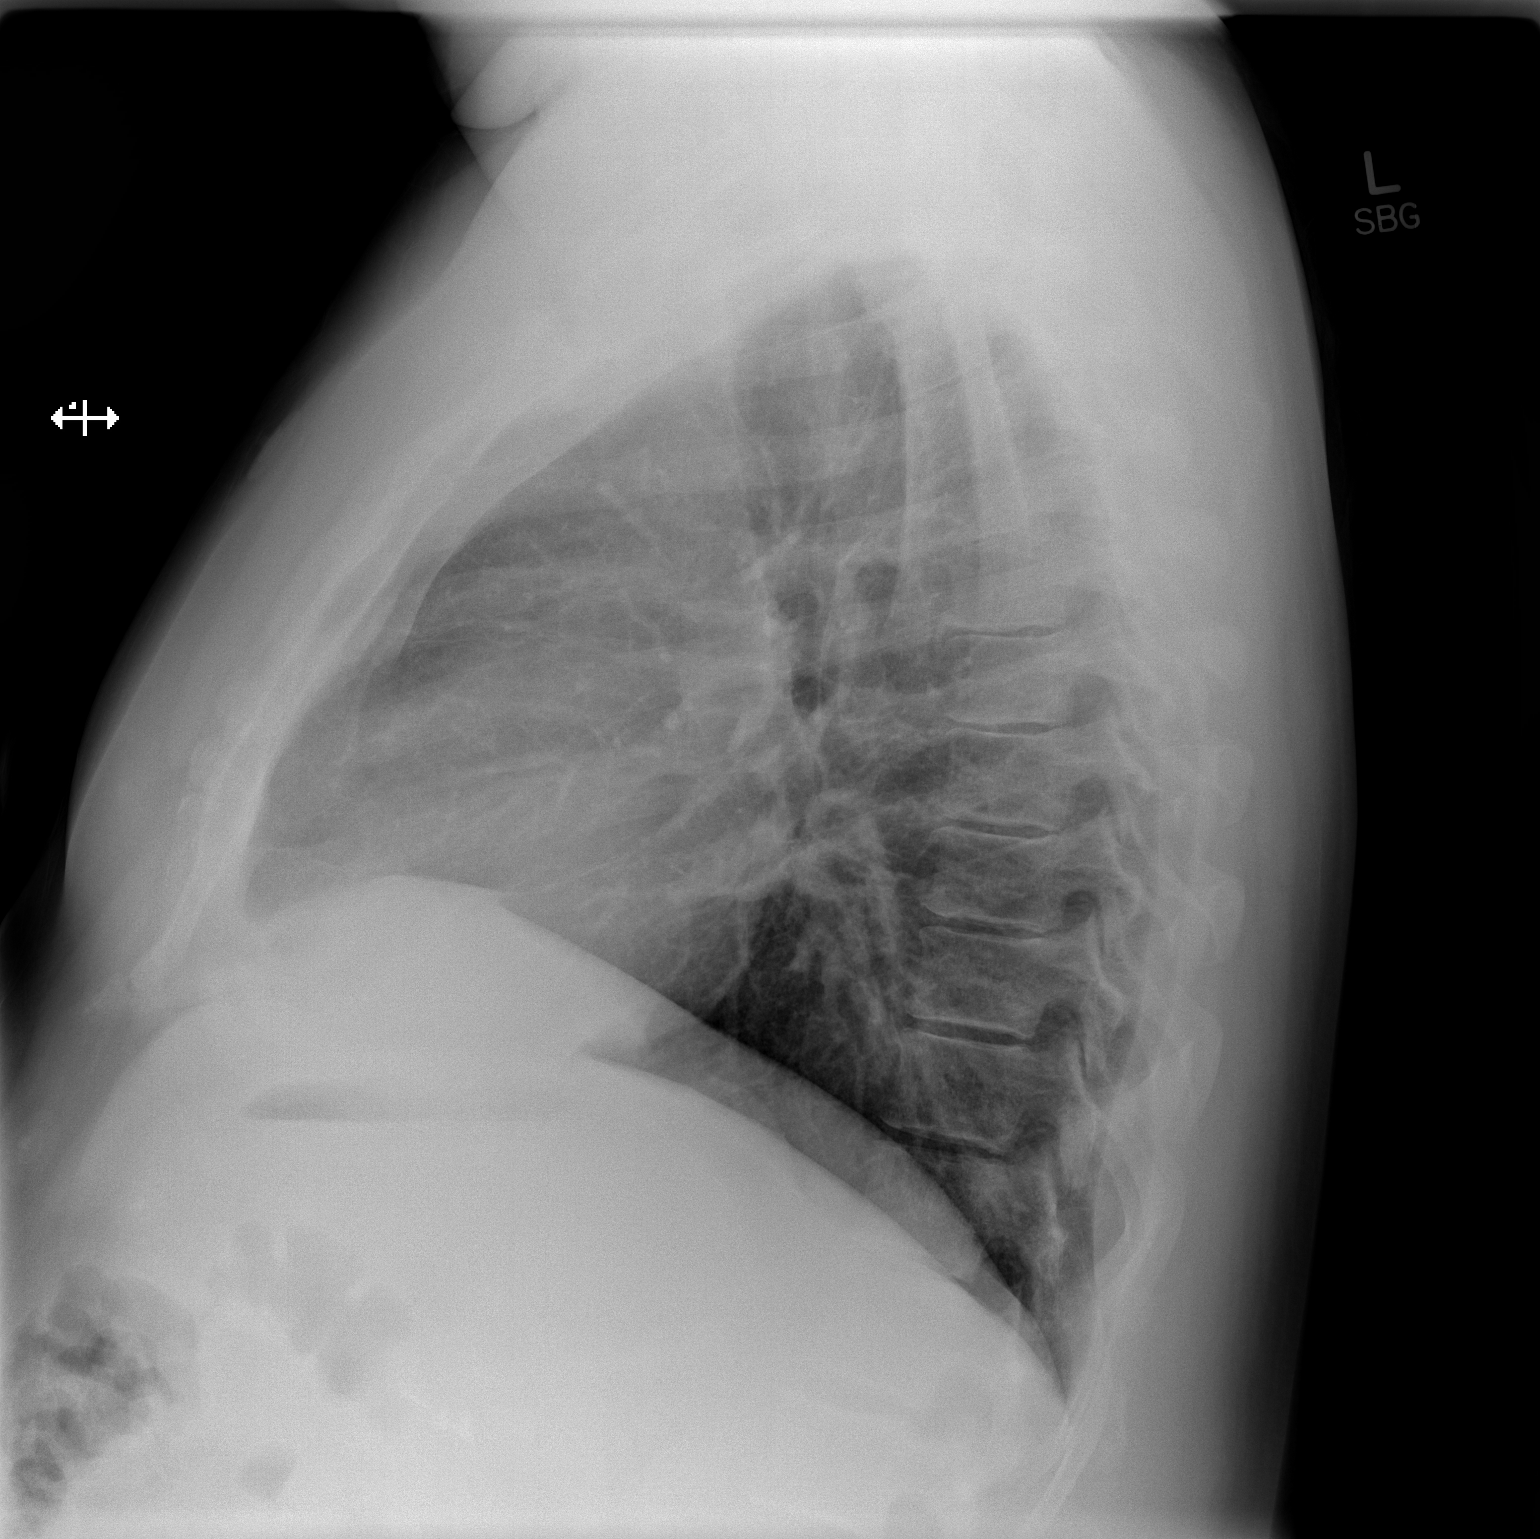

[2 of 2 positions shown; findings below may reference images not displayed]

FINDINGS: The heart size and mediastinal contours are within normal limits.
Both lungs are clear. The visualized skeletal structures are
unremarkable. Cholecystectomy clips are noted. Minimal S shaped
curvature of the thoracolumbar spine is reidentified.
IMPRESSION: No active cardiopulmonary disease.

## 2014-10-12 ENCOUNTER — Telehealth: Payer: Self-pay | Admitting: Neurology

## 2014-10-12 DIAGNOSIS — G4730 Sleep apnea, unspecified: Principal | ICD-10-CM

## 2014-10-12 DIAGNOSIS — G4710 Hypersomnia, unspecified: Secondary | ICD-10-CM

## 2014-10-12 NOTE — Telephone Encounter (Signed)
Patient contacted office this morning asking to schedule his sleep study I see in his office visit notes from November 2014 you put order for a split night, but due to the cost was unable to afford at that time. Could you put order in and I will contact insurance and schedule study with the patient.

## 2015-03-16 ENCOUNTER — Institutional Professional Consult (permissible substitution): Payer: BLUE CROSS/BLUE SHIELD | Admitting: Neurology

## 2015-03-16 ENCOUNTER — Telehealth: Payer: Self-pay

## 2015-03-16 NOTE — Telephone Encounter (Signed)
Pt did not show for his appt with Dr. Dohmeier today. 

## 2018-05-06 ENCOUNTER — Ambulatory Visit (INDEPENDENT_AMBULATORY_CARE_PROVIDER_SITE_OTHER): Payer: Non-veteran care | Admitting: Internal Medicine

## 2018-05-06 ENCOUNTER — Encounter: Payer: Self-pay | Admitting: Internal Medicine

## 2018-05-06 VITALS — BP 128/88 | HR 71 | Resp 16 | Ht 64.0 in | Wt 260.0 lb

## 2018-05-06 DIAGNOSIS — G4719 Other hypersomnia: Secondary | ICD-10-CM | POA: Diagnosis not present

## 2018-05-06 NOTE — Patient Instructions (Signed)

## 2018-05-06 NOTE — Progress Notes (Addendum)
[facility] Consultation      Assessment and Plan:  Obstructive sleep apnea with severe excessive daytime sleepiness. - [facility], will send for split-night sleep study.  GERD, anxiety. - Sleep apnea can contribute to above conditions, therefore treatment of sleep apnea is important part of their management.  Addendum 05/07/2018: [facility]  Apnea hypotony index of 25.,  30 in the supine position.  Date: 05/06/2018  MRN# [id number] Aaron Rosario November 26, 1965   Aaron Rosario is a 52 y.o. old male seen in consultation for chief complaint of:    Chief Complaint  Patient presents with  . Consult    [facility]:  . daytime sleepiness    pt c/o excessive daytime sleepiness/fatigue/snoring.    HPI:   He was noted to have fatigue, tiredness and falling asleep during the day, he can sleep up to 12 hours and feels that he did not sleep at all. No sleep walking or sleep attacks.  [place] [facility] [place] which showed apparently very severe OSA with 25 apneas per minute. He then was referred here for possible in-lab sleep study.    PMHX:   Past Medical History:  Diagnosis Date  . Anxiety   . Hx of cardiac cath    x2, no stents   . Hypersomnia, persistent   . Panic attacks    Surgical Hx:  Past Surgical History:  Procedure Laterality Date  . CHOLECYSTECTOMY     Family Hx:  History reviewed. No pertinent family history. Social Hx:   Social History   Tobacco Use  . Smoking status: Never Smoker  . Smokeless tobacco: Never Used  Substance Use Topics  . Alcohol use: No    Comment: Caffeine user 2-3 sodas a day  . Drug use: No   Medication:    Current Outpatient Medications:  .  clonazePAM (KLONOPIN) 1 MG tablet, Take 1 mg by mouth 2 (two) times daily as needed for anxiety., Disp: , Rfl:  .  meloxicam (MOBIC) 7.5 MG tablet, Take  7.5 mg by mouth daily., Disp: , Rfl:  .  omeprazole (PRILOSEC) 40 MG capsule, Take 40 mg by mouth daily., Disp: , Rfl:  .  PARoxetine (PAXIL) 40 MG tablet, Take 40 mg by mouth daily., Disp: , Rfl: 5   Allergies:  Patient has no allergy information on record.  Review of Systems: Gen:  Denies  fever, sweats, chills HEENT: Denies blurred vision, double vision. bleeds, sore throat Cvc:  No dizziness, chest pain. Resp:   Denies cough or sputum production, shortness of breath Gi: Denies swallowing difficulty, stomach pain. Gu:  Denies bladder incontinence, burning urine Ext:   No Joint pain, stiffness. Skin: No skin rash,  hives  Endoc:  No polyuria, polydipsia. Psych: No depression, insomnia. Other:  All other systems were reviewed with the patient and were negative other that what is mentioned in the HPI.   Physical Examination:   VS: BP 128/88 (BP Location: Left Arm, Cuff Size: Large)   Pulse 71   Resp 16   Ht 5\' 4"  (1.626 m)   Wt 260 lb (117.9 kg)   SpO2 98%   BMI 44.63 kg/m   General Appearance: No distress  Neuro:without focal findings,  speech normal,  HEENT: PERRLA, EOM intact.   Pulmonary: normal breath sounds, No wheezing.  CardiovascularNormal S1,S2.  No  m/r/g.   Abdomen: Benign, Soft, non-tender. Renal:  No costovertebral tenderness  GU:  No performed at this time. Endoc: No evident thyromegaly, no signs of acromegaly. Skin:   warm, no rashes, no ecchymosis  Extremities: normal, no cyanosis, clubbing.  Other findings:    LABORATORY PANEL:   CBC No results for input(s): WBC, HGB, HCT, PLT in the last 168 hours. ------------------------------------------------------------------------------------------------------------------  Chemistries  No results for input(s): NA, K, CL, CO2, GLUCOSE, BUN, CREATININE, CALCIUM, MG, AST, ALT, ALKPHOS, BILITOT in the last 168 hours.  Invalid input(s):  GFRCGP ------------------------------------------------------------------------------------------------------------------  Cardiac Enzymes No results for input(s): TROPONINI in the last 168 hours. ------------------------------------------------------------  RADIOLOGY:  No results found.     Thank  you for the consultation and for allowing [facility] to assist in the care of your patient. Our recommendations are noted above.  Please contact us if we can be of further service.   Wells Guileseep Jetaun Colbath, M.D., F.C.C.P.  Board Certified in Internal Medicine, Pulmonary Medicine, Critical Care Medicine, and Sleep Medicine.  [facility] Office Number: [phone]   05/06/2018

## 2018-05-28 ENCOUNTER — Telehealth: Payer: Self-pay | Admitting: Internal Medicine

## 2018-05-28 NOTE — Telephone Encounter (Signed)
Please call pt regarding sleep study appointment

## 2018-05-28 NOTE — Telephone Encounter (Signed)
LMOM for pt to return call. 

## 2018-05-28 NOTE — Telephone Encounter (Signed)
Pt is returning your call

## 2018-05-28 NOTE — Telephone Encounter (Signed)
Pt given direct number to [facility]. His sleep study has already been approved by [place]. Advised patient to call and schedule. Nothing further needed.

## 2018-07-31 NOTE — Telephone Encounter (Signed)
Patient due for recall but has not had sleep study .  Patient was waiting on someone to call and schedule an appt.  Please call to discuss.

## 2018-08-01 NOTE — Telephone Encounter (Signed)
Sleep Form, OV Note, Ins Card and [facility] Authorization faxed to [facility] on 05/24/18 at 10:16 am EST. Confirmation was received and still in my folder waiting to be scheduled. Called and left voice mail for Marcelino Duster at [facility] that I was re-faxing everything including the confirmation from the original fax to please contact patient and schedule. Called and spoke with patient and advised patient that order was faxed and he should expect to hear something from [facility] ASAP to schedule and to advise me if he didn't. Rhonda J Cobb

## 2018-08-01 NOTE — Telephone Encounter (Signed)
[facility] never received order for sleep study. Please arrange sleep study.

## 2018-08-14 ENCOUNTER — Ambulatory Visit: Payer: No Typology Code available for payment source | Attending: Internal Medicine

## 2018-08-27 ENCOUNTER — Ambulatory Visit: Payer: No Typology Code available for payment source | Attending: Internal Medicine

## 2018-08-27 DIAGNOSIS — G4733 Obstructive sleep apnea (adult) (pediatric): Secondary | ICD-10-CM | POA: Diagnosis present

## 2018-08-29 DIAGNOSIS — G4733 Obstructive sleep apnea (adult) (pediatric): Secondary | ICD-10-CM

## 2018-09-05 ENCOUNTER — Telehealth: Payer: Self-pay | Admitting: *Deleted

## 2018-09-05 DIAGNOSIS — G4733 Obstructive sleep apnea (adult) (pediatric): Secondary | ICD-10-CM

## 2018-09-05 NOTE — Telephone Encounter (Signed)
LMTCB FOR RESULTS OF SPLIT NIGHT.

## 2018-09-08 NOTE — Telephone Encounter (Signed)
LMTCB x2  

## 2018-09-09 NOTE — Telephone Encounter (Signed)
Patient returning call.

## 2018-09-09 NOTE — Telephone Encounter (Signed)
Pt aware of results of split night. Orders placed Nothing further needed.

## 2018-09-15 NOTE — Telephone Encounter (Signed)
Pt called and stated that after receiving results he was told about getting a CPAP and wanted to get an update on that. CB [phone]

## 2018-09-15 NOTE — Telephone Encounter (Signed)
LM for patient that the [facility] and we are just waiting on them. Would allow 14 days with [facility]. He is to call back if he has questions.

## 2021-03-04 ENCOUNTER — Emergency Department (HOSPITAL_COMMUNITY): Payer: No Typology Code available for payment source

## 2021-03-04 ENCOUNTER — Inpatient Hospital Stay (HOSPITAL_COMMUNITY)
Admission: EM | Admit: 2021-03-04 | Discharge: 2021-03-07 | DRG: 247 | Disposition: A | Discharge status: Home / Self Care | Payer: No Typology Code available for payment source | Attending: Internal Medicine | Admitting: Internal Medicine

## 2021-03-04 ENCOUNTER — Other Ambulatory Visit: Payer: Self-pay

## 2021-03-04 ENCOUNTER — Encounter (HOSPITAL_COMMUNITY): Payer: Self-pay | Admitting: *Deleted

## 2021-03-04 DIAGNOSIS — Z20822 Contact with and (suspected) exposure to covid-19: Secondary | ICD-10-CM | POA: Diagnosis present

## 2021-03-04 DIAGNOSIS — K219 Gastro-esophageal reflux disease without esophagitis: Secondary | ICD-10-CM | POA: Diagnosis present

## 2021-03-04 DIAGNOSIS — I214 Non-ST elevation (NSTEMI) myocardial infarction: Secondary | ICD-10-CM | POA: Diagnosis not present

## 2021-03-04 DIAGNOSIS — E669 Obesity, unspecified: Secondary | ICD-10-CM | POA: Diagnosis present

## 2021-03-04 DIAGNOSIS — E785 Hyperlipidemia, unspecified: Secondary | ICD-10-CM | POA: Diagnosis present

## 2021-03-04 DIAGNOSIS — J9811 Atelectasis: Secondary | ICD-10-CM | POA: Diagnosis present

## 2021-03-04 DIAGNOSIS — Z955 Presence of coronary angioplasty implant and graft: Secondary | ICD-10-CM

## 2021-03-04 DIAGNOSIS — Z6838 Body mass index (BMI) 38.0-38.9, adult: Secondary | ICD-10-CM

## 2021-03-04 DIAGNOSIS — R739 Hyperglycemia, unspecified: Secondary | ICD-10-CM | POA: Diagnosis present

## 2021-03-04 DIAGNOSIS — E876 Hypokalemia: Secondary | ICD-10-CM | POA: Diagnosis present

## 2021-03-04 DIAGNOSIS — I2510 Atherosclerotic heart disease of native coronary artery without angina pectoris: Secondary | ICD-10-CM | POA: Diagnosis present

## 2021-03-04 LAB — TROPONIN I (HIGH SENSITIVITY)
Troponin I (High Sensitivity): 20 ng/L — ABNORMAL HIGH (ref <18)
Troponin I (High Sensitivity): 244 ng/L (ref <18)

## 2021-03-04 LAB — CBC WITH DIFFERENTIAL/PLATELET
Abs Immature Granulocytes: 0.06 10*3/uL (ref 0.00–0.07)
Basophils Absolute: 0.1 10*3/uL (ref 0.0–0.1)
Basophils Relative: 1 %
Eosinophils Absolute: 0.1 10*3/uL (ref 0.0–0.5)
Eosinophils Relative: 2 %
HCT: 48.9 % (ref 39.0–52.0)
Hemoglobin: 16.6 g/dL (ref 13.0–17.0)
Immature Granulocytes: 1 %
Lymphocytes Relative: 24 %
Lymphs Abs: 1.7 10*3/uL (ref 0.7–4.0)
MCH: 29.6 pg (ref 26.0–34.0)
MCHC: 33.9 g/dL (ref 30.0–36.0)
MCV: 87.2 fL (ref 80.0–100.0)
Monocytes Absolute: 0.5 10*3/uL (ref 0.1–1.0)
Monocytes Relative: 7 %
Neutro Abs: 4.7 10*3/uL (ref 1.7–7.7)
Neutrophils Relative %: 65 %
Platelets: 294 10*3/uL (ref 150–400)
RBC: 5.61 MIL/uL (ref 4.22–5.81)
RDW: 13.3 % (ref 11.5–15.5)
WBC: 7.2 10*3/uL (ref 4.0–10.5)
nRBC: 0 % (ref 0.0–0.2)

## 2021-03-04 LAB — BASIC METABOLIC PANEL
Anion gap: 10 (ref 5–15)
BUN: 16 mg/dL (ref 6–20)
CO2: 23 mmol/L (ref 22–32)
Calcium: 9.8 mg/dL (ref 8.9–10.3)
Chloride: 103 mmol/L (ref 98–111)
Creatinine, Ser: 1.12 mg/dL (ref 0.61–1.24)
GFR, Estimated: >60 mL/min (ref >60)
Glucose, Bld: 116 mg/dL — ABNORMAL HIGH (ref 70–99)
Potassium: 3.4 mmol/L — ABNORMAL LOW (ref 3.5–5.1)
Sodium: 136 mmol/L (ref 135–145)

## 2021-03-04 MED ORDER — NITROGLYCERIN 2 % TD OINT
1.0000 [in_us] | TOPICAL_OINTMENT | Freq: Once | TRANSDERMAL | Status: AC
  Administered 2021-03-05: 1 [in_us] via TOPICAL
  Filled 2021-03-04: qty 1

## 2021-03-04 MED ORDER — HEPARIN (PORCINE) 25000 UT/250ML-% IV SOLN
1350.0000 [IU]/h | INTRAVENOUS | Status: DC
  Administered 2021-03-05 (×2): 1350 [IU]/h via INTRAVENOUS
  Filled 2021-03-04: qty 250

## 2021-03-04 MED ORDER — ASPIRIN 81 MG PO CHEW
324.0000 mg | CHEWABLE_TABLET | Freq: Once | ORAL | Status: AC
  Administered 2021-03-04: 324 mg via ORAL
  Filled 2021-03-04: qty 4

## 2021-03-04 MED ORDER — HEPARIN BOLUS VIA INFUSION
4000.0000 [IU] | Freq: Once | INTRAVENOUS | Status: AC
  Administered 2021-03-05: 4000 [IU] via INTRAVENOUS

## 2021-03-04 MED ORDER — LORAZEPAM 1 MG PO TABS
1.0000 mg | ORAL_TABLET | Freq: Once | ORAL | Status: AC
  Administered 2021-03-04: 1 mg via ORAL
  Filled 2021-03-04: qty 1

## 2021-03-04 NOTE — ED Notes (Addendum)
Numbness to left arm and left jaw x  1 hr as well.

## 2021-03-04 NOTE — ED Notes (Signed)
Date and time results received: 03/04/21 2340  Test: Troponin Critical Value: 244  Name of Provider Notified: Rancour, MD  Orders Received? Or Actions Taken?: [id number]

## 2021-03-04 NOTE — ED Provider Notes (Signed)
[facility] EMERGENCY DEPARTMENT Provider Note   CSN: [id number] Arrival date & time: 03/04/21  2038     History Chief Complaint  Patient presents with   Chest Pain    Aaron Rosario is a 55 y.o. male with a past medical history significant for anxiety, GERD, and history of panic attacks who presents to the ED due to central, nonradiating chest pain that started 1 hour prior to arrival while patient was driving.  Patient admits to associated numbness/tingling to left arm and left jaw.  Denies any weakness.  Denies associated shortness of breath and vomiting.  Admits to intermittent nausea.  Patient describes chest pain as a pressure-like sensation.  No relation to exertion.  No pleuritic nature.  No recent illness.  Patient has had 2 unremarkable catheterizations.  Per patient, he had a normal stress test in September 2021.  Denies history of blood clots, recent surgeries, recent long immobilizations, lower extremity edema, hemoptysis, and hormonal treatments.  Denies tobacco use.  No family history of early CAD.  No treatment prior to arrival.  No aggravating or alleviating symptoms.  History obtained from patient and past medical records. No interpreter used during encounter.    HPI: A 55 year old patient with a history of obesity presents for evaluation of chest pain. Initial onset of pain was approximately 1-3 hours ago. The patient's chest pain is described as heaviness/pressure/tightness and is not worse with exertion. The patient complains of nausea. The patient's chest pain is middle- or left-sided, is not well-localized, is not sharp and does radiate to the arms/jaw/neck. The patient denies diaphoresis. The patient has no history of stroke, has no history of peripheral artery disease, has not smoked in the past 90 days, denies any history of treated diabetes, has no relevant family history of coronary artery disease (first degree relative at less than age 28), is not hypertensive and  has no history of hypercholesterolemia.   Past Medical History:  Diagnosis Date   Anxiety    Hx of cardiac cath    x2, no stents    Hypersomnia, persistent    Panic attacks     Patient Active Problem List   Diagnosis Date Noted   Hypersomnia, persistent     Past Surgical History:  Procedure Laterality Date   CHOLECYSTECTOMY         History reviewed. No pertinent family history.  Social History   Tobacco Use   Smoking status: Never   Smokeless tobacco: Never  Vaping Use   Vaping Use: Never used  Substance Use Topics   Alcohol use: No    Comment: Caffeine user 2-3 sodas a day   Drug use: No    Home Medications Prior to Admission medications   Medication Sig Start Date End Date Taking? Authorizing Provider  clonazePAM (KLONOPIN) 1 MG tablet Take 1 mg by mouth 2 (two) times daily as needed for anxiety.    [provider]  meloxicam (MOBIC) 7.5 MG tablet Take 7.5 mg by mouth daily.    [provider]  omeprazole (PRILOSEC) 40 MG capsule Take 40 mg by mouth daily.    [provider]  PARoxetine (PAXIL) 40 MG tablet Take 40 mg by mouth daily. 02/01/18   [provider]    Allergies    Patient has no known allergies.  Review of Systems   Review of Systems  Constitutional:  Negative for chills and fever.  Respiratory:  Negative for shortness of breath.   Cardiovascular:  Positive for  chest pain. Negative for leg swelling.  Gastrointestinal:  Positive for nausea. Negative for abdominal pain and vomiting.  Neurological:  Positive for numbness.  All other systems reviewed and are negative.  Physical Exam Updated Vital Signs BP 128/80   Pulse 71   Temp 98.1 F (36.7 C) (Oral)   Resp 13   Ht 5\' 9"  (1.753 m)   Wt 117.9 kg   SpO2 95%   BMI 38.40 kg/m   Physical Exam Vitals and nursing note reviewed.  Constitutional:      General: He is not in acute distress.    Appearance: He is not ill-appearing.  HENT:     Head:  Normocephalic.  Eyes:     Pupils: Pupils are equal, round, and reactive to light.  Cardiovascular:     Rate and Rhythm: Normal rate and regular rhythm.     Pulses: Normal pulses.     Heart sounds: Normal heart sounds. No murmur heard.   No friction rub. No gallop.  Pulmonary:     Effort: Pulmonary effort is normal.     Breath sounds: Normal breath sounds.  Chest:     Comments: No anterior chest wall tenderness. Abdominal:     General: Abdomen is flat. There is no distension.     Palpations: Abdomen is soft.     Tenderness: There is no abdominal tenderness. There is no guarding or rebound.  Musculoskeletal:        General: Normal range of motion.     Cervical back: Neck supple.     Comments: No lower extremity edema. Negative homan sign bilaterally.  Skin:    General: Skin is warm and dry.  Neurological:     General: No focal deficit present.     Mental Status: He is alert.  Psychiatric:        Mood and Affect: Mood normal.        Behavior: Behavior normal.    ED Results / Procedures / Treatments   Labs (all labs ordered are listed, but only abnormal results are displayed) Labs Reviewed  BASIC METABOLIC PANEL - Abnormal; Notable for the following components:      Result Value   Potassium 3.4 (*)    Glucose, Bld 116 (*)    All other components within normal limits  TROPONIN I (HIGH SENSITIVITY) - Abnormal; Notable for the following components:   Troponin I (High Sensitivity) 20 (*)    All other components within normal limits  CBC WITH DIFFERENTIAL/PLATELET  TROPONIN I (HIGH SENSITIVITY)    EKG EKG Interpretation  Date/Time:  Saturday March 04 2021 20:46:35 EDT Ventricular Rate:  96 PR Interval:  153 QRS Duration: 111 QT Interval:  374 QTC Calculation: 473 R Axis:   -71 Text Interpretation: Sinus rhythm LAD, consider left anterior fascicular block Abnormal R-wave progression, late transition ST elevation, consider inferior injury unchanged from 2015 Confirmed by  07-12-2000 (Eber Hong) on 03/04/2021 9:12:14 PM  Radiology DG Chest Portable 1 View  Result Date: 03/04/2021 CLINICAL DATA:  Chest pain EXAM: PORTABLE CHEST 1 VIEW COMPARISON:  Chest x-ray 04/05/2014 FINDINGS: The heart size and mediastinal contours are within normal limits. Left base streaky/hazy airspace opacity. No focal consolidation. No pulmonary edema. No pleural effusion. No pneumothorax. No acute osseous abnormality. IMPRESSION: Peripheral left base airspace opacity that may represent infection/inflammation versus atelectasis. Followup PA and lateral chest X-ray is recommended in 3-4 weeks following therapy to ensure resolution. Electronically Signed   By: 04/07/2014.D.  On: 03/04/2021 21:38    Procedures Procedures   Medications Ordered in ED Medications  aspirin chewable tablet 324 mg (324 mg Oral Given 03/04/21 2109)  LORazepam (ATIVAN) tablet 1 mg (1 mg Oral Given 03/04/21 2119)    ED Course  I have reviewed the triage vital signs and the nursing notes.  Pertinent labs & imaging results that were available during my care of the patient were reviewed by me and considered in my medical decision making (see chart for details).  Clinical Course as of 03/04/21 2340  Sat Mar 04, 2021  2144 Potassium(!): 3.4 [CA]  2144 Glucose(!): 116 [CA]    Clinical Course User Index [CA] Mannie Stabile, PA-C   MDM Rules/Calculators/A&P HEAR Score: 77                       55 year old male presents to the ED due to reading chest pressure that began 1 hour prior to arrival while driving.  Patient has had 2 previous cardiac catheterizations in the distant past.  Unremarkable stress test in September 2021 per patient.  Upon arrival, patient afebrile, not tachycardic or hypoxic.  Patient in no acute distress.  Physical exam reassuring.  No lower extremity edema.  Cardiac labs ordered to rule out cardiac etiology.  ASA given. Discussed case with Dr. Hyacinth Meeker who agrees with assessment and  plan.  EKG reviewed which demonstrates normal sinus rhythm.  No change in appearance from previous EKG.  CBC unremarkable no leukocytosis and normal hemoglobin.  Initial troponin elevated at 20.  Will obtain delta troponin rule out ACS.  BMP significant for mild hypokalemia at 3.4 and hyperglycemia 116.  No anion gap.  Doubt DKA.  Normal renal function. CXR personally reviewed which demonstrates: IMPRESSION:  Peripheral left base airspace opacity that may represent  infection/inflammation versus atelectasis. Followup PA and lateral  chest X-ray is recommended in 3-4 weeks following therapy to ensure  resolution.   Patient handed off to Dr. Manus Gunning pending 2nd troponin.  Final Clinical Impression(s) / ED Diagnoses Final diagnoses:  None    Rx / DC Orders ED Discharge Orders     None        Jesusita Oka 03/04/21 2342    Eber Hong, MD 03/04/21 2349

## 2021-03-04 NOTE — Progress Notes (Signed)
ANTICOAGULATION CONSULT NOTE - Initial Consult  Pharmacy Consult for Heparin Indication: chest pain/ACS  No Known Allergies  Patient Measurements: Height: 5\' 9"  (175.3 cm) Weight: 117.9 kg (260 lb) IBW/kg (Calculated) : 70.7 Heparin Dosing Weight: 98 kg  Vital Signs: Temp: 98.1 F (36.7 C) (06/11 2050) Temp Source: Oral (06/11 2050) BP: 130/90 (06/11 2300) Pulse Rate: 72 (06/11 2300)  Labs: Recent Labs    03/04/21 2058 03/04/21 2258  HGB 16.6  --   HCT 48.9  --   PLT 294  --   CREATININE 1.12  --   TROPONINIHS 20* 244*    Estimated Creatinine Clearance: 97.8 mL/min (by C-G formula based on SCr of 1.12 mg/dL).   Medical History: Past Medical History:  Diagnosis Date   Anxiety    Hx of cardiac cath    x2, no stents    Hypersomnia, persistent    Panic attacks     Medications:  Awaiting home med rec  Assessment: 55 y.o. M presents with CP. To begin heparin for ACS. No AC PTA. CBC ok on admission.  Goal of Therapy:  Heparin level 0.3-0.7 units/ml Monitor platelets by anticoagulation protocol: Yes   Plan:  Heparin IV bolus 4000 units Heparin gtt at 1350 units/hr Will f/u heparin level in 6 hours Daily heparin level and CBC  44, PharmD, BCPS Please see amion for complete clinical pharmacist phone list 03/04/2021,11:49 PM

## 2021-03-04 NOTE — ED Notes (Signed)
X-ray at bedside

## 2021-03-04 NOTE — ED Triage Notes (Signed)
Pt with mid to left CP with mild nausea x 1 hr while driving, denies sob or diaphoresis.

## 2021-03-05 ENCOUNTER — Other Ambulatory Visit (HOSPITAL_COMMUNITY): Payer: No Typology Code available for payment source

## 2021-03-05 DIAGNOSIS — Z6838 Body mass index (BMI) 38.0-38.9, adult: Secondary | ICD-10-CM | POA: Diagnosis not present

## 2021-03-05 DIAGNOSIS — R9431 Abnormal electrocardiogram [ECG] [EKG]: Secondary | ICD-10-CM | POA: Diagnosis not present

## 2021-03-05 DIAGNOSIS — J9811 Atelectasis: Secondary | ICD-10-CM | POA: Diagnosis present

## 2021-03-05 DIAGNOSIS — Z20822 Contact with and (suspected) exposure to covid-19: Secondary | ICD-10-CM | POA: Diagnosis present

## 2021-03-05 DIAGNOSIS — E669 Obesity, unspecified: Secondary | ICD-10-CM | POA: Diagnosis present

## 2021-03-05 DIAGNOSIS — I2510 Atherosclerotic heart disease of native coronary artery without angina pectoris: Secondary | ICD-10-CM | POA: Diagnosis present

## 2021-03-05 DIAGNOSIS — E876 Hypokalemia: Secondary | ICD-10-CM | POA: Diagnosis present

## 2021-03-05 DIAGNOSIS — K219 Gastro-esophageal reflux disease without esophagitis: Secondary | ICD-10-CM | POA: Diagnosis present

## 2021-03-05 DIAGNOSIS — I214 Non-ST elevation (NSTEMI) myocardial infarction: Secondary | ICD-10-CM | POA: Diagnosis present

## 2021-03-05 DIAGNOSIS — R739 Hyperglycemia, unspecified: Secondary | ICD-10-CM | POA: Diagnosis present

## 2021-03-05 DIAGNOSIS — E785 Hyperlipidemia, unspecified: Secondary | ICD-10-CM | POA: Diagnosis present

## 2021-03-05 LAB — HEPARIN LEVEL (UNFRACTIONATED)
Heparin Unfractionated: 0.44 IU/mL (ref 0.30–0.70)
Heparin Unfractionated: 0.45 IU/mL (ref 0.30–0.70)

## 2021-03-05 LAB — CBC
HCT: 45.8 % (ref 39.0–52.0)
HCT: 43 % (ref 39.0–52.0)
Hemoglobin: 15.3 g/dL (ref 13.0–17.0)
Hemoglobin: 14.1 g/dL (ref 13.0–17.0)
MCH: 29.1 pg (ref 26.0–34.0)
MCH: 28.7 pg (ref 26.0–34.0)
MCHC: 33.4 g/dL (ref 30.0–36.0)
MCHC: 32.8 g/dL (ref 30.0–36.0)
MCV: 87.1 fL (ref 80.0–100.0)
MCV: 87.6 fL (ref 80.0–100.0)
Platelets: 261 10*3/uL (ref 150–400)
Platelets: 263 10*3/uL (ref 150–400)
RBC: 5.26 MIL/uL (ref 4.22–5.81)
RBC: 4.91 MIL/uL (ref 4.22–5.81)
RDW: 13.3 % (ref 11.5–15.5)
RDW: 13.4 % (ref 11.5–15.5)
WBC: 6.4 10*3/uL (ref 4.0–10.5)
WBC: 8.6 10*3/uL (ref 4.0–10.5)
nRBC: 0 % (ref 0.0–0.2)
nRBC: 0 % (ref 0.0–0.2)

## 2021-03-05 LAB — MRSA NEXT GEN BY PCR, NASAL: MRSA by PCR Next Gen: NOT DETECTED

## 2021-03-05 LAB — RESP PANEL BY RT-PCR (FLU A&B, COVID) ARPGX2
Influenza A by PCR: NEGATIVE
Influenza B by PCR: NEGATIVE
SARS Coronavirus 2 by RT PCR: NEGATIVE

## 2021-03-05 LAB — COMPREHENSIVE METABOLIC PANEL
ALT: 22 U/L (ref 0–44)
AST: 32 U/L (ref 15–41)
Albumin: 3.9 g/dL (ref 3.5–5.0)
Alkaline Phosphatase: 71 U/L (ref 38–126)
Anion gap: 10 (ref 5–15)
BUN: 14 mg/dL (ref 6–20)
CO2: 26 mmol/L (ref 22–32)
Calcium: 9.3 mg/dL (ref 8.9–10.3)
Chloride: 104 mmol/L (ref 98–111)
Creatinine, Ser: 1.04 mg/dL (ref 0.61–1.24)
GFR, Estimated: >60 mL/min (ref >60)
Glucose, Bld: 116 mg/dL — ABNORMAL HIGH (ref 70–99)
Potassium: 4.7 mmol/L (ref 3.5–5.1)
Sodium: 140 mmol/L (ref 135–145)
Total Bilirubin: 1.4 mg/dL — ABNORMAL HIGH (ref 0.3–1.2)
Total Protein: 6.5 g/dL (ref 6.5–8.1)

## 2021-03-05 LAB — TROPONIN I (HIGH SENSITIVITY)
Troponin I (High Sensitivity): 5433 ng/L (ref <18)
Troponin I (High Sensitivity): 5206 ng/L (ref <18)

## 2021-03-05 LAB — BRAIN NATRIURETIC PEPTIDE: B Natriuretic Peptide: 9.4 pg/mL (ref 0.0–100.0)

## 2021-03-05 LAB — HIV ANTIBODY (ROUTINE TESTING W REFLEX): HIV Screen 4th Generation wRfx: NONREACTIVE

## 2021-03-05 MED ORDER — CLONAZEPAM 0.5 MG PO TABS: 1.0000 mg | ORAL_TABLET | Freq: Two times a day (BID) | ORAL | Status: DC | PRN

## 2021-03-05 MED ORDER — BUSPIRONE HCL 10 MG PO TABS
10.0000 mg | ORAL_TABLET | Freq: Every day | ORAL | Status: DC
  Administered 2021-03-05 – 2021-03-07 (×2): 10 mg via ORAL
  Filled 2021-03-05 (×3): qty 1

## 2021-03-05 MED ORDER — ONDANSETRON HCL 4 MG/2ML IJ SOLN: 4.0000 mg | Freq: Four times a day (QID) | INTRAMUSCULAR | Status: DC | PRN

## 2021-03-05 MED ORDER — ACETAMINOPHEN 325 MG PO TABS: 650.0000 mg | ORAL_TABLET | ORAL | Status: DC | PRN

## 2021-03-05 MED ORDER — SODIUM CHLORIDE 0.9 % IV SOLN: 250.0000 mL | INTRAVENOUS | Status: DC | PRN

## 2021-03-05 MED ORDER — SODIUM CHLORIDE 0.9% FLUSH: 3.0000 mL | INTRAVENOUS | Status: DC | PRN

## 2021-03-05 MED ORDER — ASPIRIN EC 81 MG PO TBEC
81.0000 mg | DELAYED_RELEASE_TABLET | Freq: Every day | ORAL | Status: DC
  Administered 2021-03-06 – 2021-03-07 (×2): 81 mg via ORAL
  Filled 2021-03-05 (×2): qty 1

## 2021-03-05 MED ORDER — NITROGLYCERIN 0.4 MG SL SUBL: 0.4000 mg | SUBLINGUAL_TABLET | SUBLINGUAL | Status: DC | PRN

## 2021-03-05 MED ORDER — PANTOPRAZOLE SODIUM 40 MG PO TBEC
40.0000 mg | DELAYED_RELEASE_TABLET | Freq: Every day | ORAL | Status: DC
  Administered 2021-03-05 – 2021-03-07 (×3): 40 mg via ORAL
  Filled 2021-03-05 (×3): qty 1

## 2021-03-05 MED ORDER — SODIUM CHLORIDE 0.9 % WEIGHT BASED INFUSION
3.0000 mL/kg/h | INTRAVENOUS | Status: AC
  Administered 2021-03-06: 3 mL/kg/h via INTRAVENOUS

## 2021-03-05 MED ORDER — METOPROLOL TARTRATE 12.5 MG HALF TABLET
12.5000 mg | ORAL_TABLET | Freq: Two times a day (BID) | ORAL | Status: DC
  Administered 2021-03-05 – 2021-03-07 (×5): 12.5 mg via ORAL
  Filled 2021-03-05 (×5): qty 1

## 2021-03-05 MED ORDER — SODIUM CHLORIDE 0.9 % WEIGHT BASED INFUSION
1.0000 mL/kg/h | INTRAVENOUS | Status: DC
  Administered 2021-03-06 (×2): 1 mL/kg/h via INTRAVENOUS

## 2021-03-05 MED ORDER — PAROXETINE HCL 20 MG PO TABS
40.0000 mg | ORAL_TABLET | Freq: Every day | ORAL | Status: DC
  Administered 2021-03-05 – 2021-03-07 (×3): 40 mg via ORAL
  Filled 2021-03-05 (×3): qty 2

## 2021-03-05 MED ORDER — SODIUM CHLORIDE 0.9% FLUSH
3.0000 mL | Freq: Two times a day (BID) | INTRAVENOUS | Status: DC
  Administered 2021-03-05 – 2021-03-06 (×3): 3 mL via INTRAVENOUS

## 2021-03-05 MED ORDER — ATORVASTATIN CALCIUM 80 MG PO TABS
80.0000 mg | ORAL_TABLET | Freq: Every day | ORAL | Status: DC
  Administered 2021-03-05 – 2021-03-07 (×3): 80 mg via ORAL
  Filled 2021-03-05 (×3): qty 1

## 2021-03-05 MED ORDER — ALPRAZOLAM 0.25 MG PO TABS: 0.2500 mg | ORAL_TABLET | Freq: Two times a day (BID) | ORAL | Status: DC | PRN

## 2021-03-05 NOTE — ED Provider Notes (Signed)
Patient chest pain-free after Nitropaste.  Admission to cardiology service discussed with Dr. Welton Flakes.  At [facility].  He agrees with IV heparin and will accept to his service at [facility]. Patient and wife in agreement. Asked to notify staff if chest pain returns.   Glynn Octave, MD 03/05/21 [phone]

## 2021-03-05 NOTE — Plan of Care (Signed)

## 2021-03-05 NOTE — H&P (Signed)
Cardiology Admission History and Physical:   Patient ID: Aaron Rosario MRN: [id number]; DOB: 08-24-1966   Admission date: 03/04/2021  PCP:  Chauncey Mann, MD   [facility] Providers Cardiologist:  None       Chief Complaint: Chest pain  Patient Profile:   Aaron Rosario is a 55 y.o. male with pmh of anxiety and GERD who is being seen 03/05/2021 for the evaluation of NSTEMI  History of Present Illness:   Aaron Rosario is a 55 y.o. male with pmh of anxiety and GERD who is being seen 03/05/2021 for the evaluation of NSTEMI. He states he had chest pressure while driving yesterday which led to him coming to OSH ED, where his troponin rose from 20s to 200s. No significant EKG changes. ASA and heparin were given. [facility] was called for NSTEMI management. Currently he is CP free. Denies any SOB as well. Feels comfortable. CXR shows some atelectasis. He notes that he had 2 LHC done long time ago for some atypical CP; they were normal. However states this time, pain pressure was more. Only takes medications for anxiety at home. Non-smoker.    Past Medical History:  Diagnosis Date   Anxiety    Hx of cardiac cath    x2, no stents    Hypersomnia, persistent    Panic attacks     Past Surgical History:  Procedure Laterality Date   CHOLECYSTECTOMY       Medications Prior to Admission: Prior to Admission medications   Medication Sig Start Date End Date Taking? Authorizing Provider  clonazePAM (KLONOPIN) 1 MG tablet Take 1 mg by mouth 2 (two) times daily as needed for anxiety.    [provider]  meloxicam (MOBIC) 7.5 MG tablet Take 7.5 mg by mouth daily.    [provider]  omeprazole (PRILOSEC) 40 MG capsule Take 40 mg by mouth daily.    [provider]  PARoxetine (PAXIL) 40 MG tablet Take 40 mg by mouth daily. 02/01/18   [provider]     Allergies:   No Known Allergies  Social History:   Social History   Socioeconomic History    Marital status: Single    Spouse name: Not on file   Number of children: 0   Years of education: 13   Highest education level: Not on file  Occupational History    Employer: [employer]    Comment: [employer]  Tobacco Use   Smoking status: Never   Smokeless tobacco: Never  Vaping Use   Vaping Use: Never used  Substance and Sexual Activity   Alcohol use: No    Comment: Caffeine user 2-3 sodas a day   Drug use: No   Sexual activity: Not on file  Other Topics Concern   Not on file  Social History Narrative   Not on file   Social Determinants of Health   Financial Resource Strain: Not on file  Food Insecurity: Not on file  Transportation Needs: Not on file  Physical Activity: Not on file  Stress: Not on file  Social Connections: Not on file  Intimate Partner Violence: Not on file    Family History:  No sudden cardiac deaths  ROS:  Please see the history of present illness.  All other ROS reviewed and negative.     Physical Exam/Data:   Vitals:   03/05/21 0300 03/05/21 0330 03/05/21 0430 03/05/21 0558  BP: 103/72 96/60 105/72   Pulse: 77 66 60  Resp: 20 18 16    Temp:    98.5 F (36.9 C)  TempSrc:    Oral  SpO2: 95% 96% 96%   Weight:    116.8 kg  Height:    5\' 9"  (1.753 m)   No intake or output data in the 24 hours ending 03/05/21 0632 Last 3 Weights 03/05/2021 03/04/2021 05/06/2018  Weight (lbs) 257 lb 8 oz 260 lb 260 lb  Weight (kg) 116.8 kg 117.935 kg 117.935 kg     Body mass index is 38.03 kg/m.  General:  Well nourished, well developed, in no acute distress HEENT: normal Lymph: no adenopathy Neck: noJVD Endocrine:  No thryomegaly Vascular: No carotid bruits; FA pulses 2+ bilaterally without bruits  Cardiac:  normal S1, S2; RRR; no murmur Lungs:  clear to auscultation bilaterally, no wheezing, rhonchi or rales  Abd: soft, nontender, no hepatomegaly  Ext: no leg edema Musculoskeletal:  No deformities, BUE and BLE strength normal and  equal Skin: warm and dry  Neuro:  CNs 2-12 intact, no focal abnormalities noted Psych:  Normal affect    EKG:  The ECG that was done was personally reviewed and demonstrates NSR with no ST elevations.    Laboratory Data:  High Sensitivity Troponin:   Recent Labs  Lab 03/04/21 2058 03/04/21 2258  TROPONINIHS 20* 244*      Chemistry Recent Labs  Lab 03/04/21 2058  NA 136  K 3.4*  CL 103  CO2 23  GLUCOSE 116*  BUN 16  CREATININE 1.12  CALCIUM 9.8  GFRNONAA >60  ANIONGAP 10    No results for input(s): PROT, ALBUMIN, AST, ALT, ALKPHOS, BILITOT in the last 168 hours. Hematology Recent Labs  Lab 03/04/21 2058  WBC 7.2  RBC 5.61  HGB 16.6  HCT 48.9  MCV 87.2  MCH 29.6  MCHC 33.9  RDW 13.3  PLT 294   BNPNo results for input(s): BNP, PROBNP in the last 168 hours.  DDimer No results for input(s): DDIMER in the last 168 hours.   Radiology/Studies:  DG Chest Portable 1 View  Result Date: 03/04/2021 CLINICAL DATA:  Chest pain EXAM: PORTABLE CHEST 1 VIEW COMPARISON:  Chest x-ray 04/05/2014 FINDINGS: The heart size and mediastinal contours are within normal limits. Left base streaky/hazy airspace opacity. No focal consolidation. No pulmonary edema. No pleural effusion. No pneumothorax. No acute osseous abnormality. IMPRESSION: Peripheral left base airspace opacity that may represent infection/inflammation versus atelectasis. Followup PA and lateral chest X-ray is recommended in 3-4 weeks following therapy to ensure resolution. Electronically Signed   By: 05/04/2021 M.D.   On: 03/04/2021 21:38     Assessment and Plan:   # NSTEMI -Significant delta troponin with chest pain and good story -Needs LHC Monday morning AM (currently stable) -C/w Heparin per pharmacy -C/w aspirin -Start atorvastatin 80mg  daily -Start low dose metoprolol 12.5 mg BID -C/w to trend EKG and troponin -Hold P2Y12 for now -Get TTE -Get Lipid panel and A1c  # Anxiety: C/w home meds #  GERD: C/w PPI   Severity of Illness: The appropriate patient status for this patient is INPATIENT. Inpatient status is judged to be reasonable and necessary in order to provide the required intensity of service to ensure the patient's safety. The patient's presenting symptoms, physical exam findings, and initial radiographic and laboratory data in the context of their chronic comorbidities is felt to place them at high risk for further clinical deterioration. Furthermore, it is not anticipated that the patient will be medically  stable for discharge from the hospital within 2 midnights of admission. The following factors support the patient status of inpatient.   " The patient's presenting symptoms include CP. " The initial radiographic and laboratory data are worrisome because of delta troponin   I certify that at the point of admission it is my clinical judgment that the patient will require inpatient hospital care spanning beyond 2 midnights from the point of admission due to high intensity of service, high risk for further deterioration and high frequency of surveillance required.   For questions or updates, please contact [facility] Please consult [email or web address] for contact info under     Signed, Hermelinda Dellen, MD  03/05/2021 6:32 AM

## 2021-03-05 NOTE — ED Notes (Signed)
Report given to [facility]

## 2021-03-05 NOTE — Progress Notes (Signed)
ANTICOAGULATION CONSULT NOTE   Pharmacy Consult for Heparin Indication: chest pain/ACS  No Known Allergies  Patient Measurements: Height: 5\' 9"  (175.3 cm) Weight: 116.8 kg (257 lb 8 oz) IBW/kg (Calculated) : 70.7 Heparin Dosing Weight: 98 kg  Vital Signs: Temp: 98.5 F (36.9 C) (06/12 1148) Temp Source: Oral (06/12 1148) BP: 107/67 (06/12 1148) Pulse Rate: 72 (06/12 1148)  Labs: Recent Labs    03/04/21 2058 03/04/21 2258 03/05/21 0615 03/05/21 0650 03/05/21 0929  HGB 16.6  --   --   --   --   HCT 48.9  --   --   --   --   PLT 294  --   --   --   --   HEPARINUNFRC  --   --  0.44  --   --   CREATININE 1.12  --   --  1.04  --   TROPONINIHS 20* 244*  --  5,433* 5,206*     Estimated Creatinine Clearance: 104.7 mL/min (by C-G formula based on SCr of 1.04 mg/dL).   Medical History: Past Medical History:  Diagnosis Date   Anxiety    Hx of cardiac cath    x2, no stents    Hypersomnia, persistent    Panic attacks     Medications:  Awaiting home med rec  Assessment: 55 y.o. M presents with CP. To begin heparin for ACS. No AC PTA. CBC ok on admission.  Heparin level this AM 0.44, at goal.  No overt bleeding or complications noted.  Goal of Therapy:  Heparin level 0.3-0.7 units/ml Monitor platelets by anticoagulation protocol: Yes   Plan:  Continue IV heparin at 1350 units/hr Confirm heparin level now. Daily heparin level and CBC. F/u after cath Monday.  Wednesday, Reece Leader, BCCP Clinical Pharmacist  03/05/2021 1:37 PM   [facility] pharmacy phone numbers are listed on [email or web address]

## 2021-03-05 NOTE — Progress Notes (Addendum)
Critical lab value troponin 5433 called to Laverda Page NP--EKG performed and on chart-patient is asymptomatic

## 2021-03-05 NOTE — Progress Notes (Signed)
Currently not having CP or SOB. No ST segment elevation on ekg.   Resting comfortably.  I would therefore recommend left heart catheterization with possible PCI.  Discussed the cath with the patient. The patient understands that risks included but are not limited to stroke (1 in 1000), death (1 in 1000), kidney failure [usually temporary] (1 in 500), bleeding (1 in 200), allergic reaction [possibly serious] (1 in 200). The patient understands and agrees to proceed.  We will place on cath board for tomorrow am.  Hillis Range MD, [facility] 03/05/2021 10:23 AM

## 2021-03-06 ENCOUNTER — Inpatient Hospital Stay (HOSPITAL_COMMUNITY)
Admission: EM | Disposition: A | Discharge status: Home / Self Care | Payer: Self-pay | Source: Home / Self Care | Attending: Internal Medicine

## 2021-03-06 ENCOUNTER — Inpatient Hospital Stay (HOSPITAL_COMMUNITY): Payer: No Typology Code available for payment source

## 2021-03-06 ENCOUNTER — Encounter (HOSPITAL_COMMUNITY): Payer: Self-pay | Admitting: Cardiovascular Disease

## 2021-03-06 DIAGNOSIS — I2510 Atherosclerotic heart disease of native coronary artery without angina pectoris: Secondary | ICD-10-CM

## 2021-03-06 DIAGNOSIS — E785 Hyperlipidemia, unspecified: Secondary | ICD-10-CM

## 2021-03-06 DIAGNOSIS — I214 Non-ST elevation (NSTEMI) myocardial infarction: Principal | ICD-10-CM

## 2021-03-06 DIAGNOSIS — R9431 Abnormal electrocardiogram [ECG] [EKG]: Secondary | ICD-10-CM

## 2021-03-06 HISTORY — PX: CORONARY STENT INTERVENTION: CATH118234

## 2021-03-06 HISTORY — PX: LEFT HEART CATH AND CORONARY ANGIOGRAPHY: CATH118249

## 2021-03-06 LAB — LIPID PANEL
Cholesterol: 174 mg/dL (ref 0–200)
HDL: 33 mg/dL — ABNORMAL LOW (ref >40)
LDL Cholesterol: 115 mg/dL — ABNORMAL HIGH (ref 0–99)
Total CHOL/HDL Ratio: 5.3 RATIO
Triglycerides: 131 mg/dL (ref <150)
VLDL: 26 mg/dL (ref 0–40)

## 2021-03-06 LAB — ECHOCARDIOGRAM COMPLETE
AR max vel: 2.07 cm2
AV Area VTI: 2 cm2
AV Area mean vel: 1.94 cm2
AV Mean grad: 3 mmHg
AV Peak grad: 5.7 mmHg
Ao pk vel: 1.19 m/s
Area-P 1/2: 4.89 cm2
Calc EF: 49.4 %
Height: 69 in
S' Lateral: 2.7 cm
Single Plane A2C EF: 46.1 %
Single Plane A4C EF: 50.8 %
Weight: 4119.96 oz

## 2021-03-06 LAB — CBC
HCT: 41.7 % (ref 39.0–52.0)
Hemoglobin: 13.9 g/dL (ref 13.0–17.0)
MCH: 29.1 pg (ref 26.0–34.0)
MCHC: 33.3 g/dL (ref 30.0–36.0)
MCV: 87.4 fL (ref 80.0–100.0)
Platelets: 247 10*3/uL (ref 150–400)
RBC: 4.77 MIL/uL (ref 4.22–5.81)
RDW: 13.3 % (ref 11.5–15.5)
WBC: 8.1 10*3/uL (ref 4.0–10.5)
nRBC: 0 % (ref 0.0–0.2)

## 2021-03-06 LAB — HEMOGLOBIN A1C
Hgb A1c MFr Bld: 5.6 % (ref 4.8–5.6)
Mean Plasma Glucose: 114 mg/dL

## 2021-03-06 LAB — POCT ACTIVATED CLOTTING TIME: Activated Clotting Time: 289 seconds

## 2021-03-06 LAB — HEPARIN LEVEL (UNFRACTIONATED): Heparin Unfractionated: 0.4 IU/mL (ref 0.30–0.70)

## 2021-03-06 SURGERY — LEFT HEART CATH AND CORONARY ANGIOGRAPHY
Anesthesia: LOCAL

## 2021-03-06 MED ORDER — HEPARIN SODIUM (PORCINE) 1000 UNIT/ML IJ SOLN
INTRAMUSCULAR | Status: AC
  Filled 2021-03-06: qty 1

## 2021-03-06 MED ORDER — LIDOCAINE HCL (PF) 1 % IJ SOLN
INTRAMUSCULAR | Status: DC | PRN
  Administered 2021-03-06: 2 mL

## 2021-03-06 MED ORDER — VERAPAMIL HCL 2.5 MG/ML IV SOLN
INTRAVENOUS | Status: DC | PRN
  Administered 2021-03-06: 10 mL via INTRA_ARTERIAL

## 2021-03-06 MED ORDER — SODIUM CHLORIDE 0.9 % IV SOLN: 250.0000 mL | INTRAVENOUS | Status: DC | PRN

## 2021-03-06 MED ORDER — VERAPAMIL HCL 2.5 MG/ML IV SOLN
INTRAVENOUS | Status: AC
  Filled 2021-03-06: qty 2

## 2021-03-06 MED ORDER — FENTANYL CITRATE (PF) 100 MCG/2ML IJ SOLN
INTRAMUSCULAR | Status: AC
  Filled 2021-03-06: qty 2

## 2021-03-06 MED ORDER — TICAGRELOR 90 MG PO TABS
ORAL_TABLET | ORAL | Status: DC | PRN
  Administered 2021-03-06: 180 mg via ORAL

## 2021-03-06 MED ORDER — HEPARIN SODIUM (PORCINE) 1000 UNIT/ML IJ SOLN
INTRAMUSCULAR | Status: DC | PRN
  Administered 2021-03-06 (×2): 6000 [IU] via INTRAVENOUS
  Administered 2021-03-06: 2000 [IU] via INTRAVENOUS

## 2021-03-06 MED ORDER — HEPARIN (PORCINE) IN NACL 1000-0.9 UT/500ML-% IV SOLN
INTRAVENOUS | Status: DC | PRN
  Administered 2021-03-06 (×2): 500 mL

## 2021-03-06 MED ORDER — DIPHENHYDRAMINE HCL 50 MG/ML IJ SOLN
INTRAMUSCULAR | Status: AC
  Filled 2021-03-06: qty 1

## 2021-03-06 MED ORDER — SODIUM CHLORIDE 0.9 % WEIGHT BASED INFUSION: 1.0000 mL/kg/h | INTRAVENOUS | Status: AC

## 2021-03-06 MED ORDER — MIDAZOLAM HCL 2 MG/2ML IJ SOLN
INTRAMUSCULAR | Status: DC | PRN
  Administered 2021-03-06: 2 mg via INTRAVENOUS

## 2021-03-06 MED ORDER — LIDOCAINE HCL (PF) 1 % IJ SOLN
INTRAMUSCULAR | Status: AC
  Filled 2021-03-06: qty 30

## 2021-03-06 MED ORDER — HEPARIN (PORCINE) IN NACL 1000-0.9 UT/500ML-% IV SOLN
INTRAVENOUS | Status: AC
  Filled 2021-03-06: qty 1000

## 2021-03-06 MED ORDER — TICAGRELOR 90 MG PO TABS
ORAL_TABLET | ORAL | Status: AC
  Filled 2021-03-06: qty 2

## 2021-03-06 MED ORDER — DIPHENHYDRAMINE HCL 50 MG/ML IJ SOLN
INTRAMUSCULAR | Status: DC | PRN
  Administered 2021-03-06: 25 mg via INTRAVENOUS

## 2021-03-06 MED ORDER — NITROGLYCERIN 1 MG/10 ML FOR IR/CATH LAB
INTRA_ARTERIAL | Status: DC | PRN
  Administered 2021-03-06 (×3): 150 ug via INTRACORONARY

## 2021-03-06 MED ORDER — LABETALOL HCL 5 MG/ML IV SOLN: 10.0000 mg | INTRAVENOUS | Status: AC | PRN

## 2021-03-06 MED ORDER — MIDAZOLAM HCL 2 MG/2ML IJ SOLN
INTRAMUSCULAR | Status: AC
  Filled 2021-03-06: qty 2

## 2021-03-06 MED ORDER — TICAGRELOR 90 MG PO TABS
90.0000 mg | ORAL_TABLET | Freq: Two times a day (BID) | ORAL | Status: DC
  Administered 2021-03-06 – 2021-03-07 (×2): 90 mg via ORAL
  Filled 2021-03-06 (×2): qty 1

## 2021-03-06 MED ORDER — SODIUM CHLORIDE 0.9% FLUSH
3.0000 mL | Freq: Two times a day (BID) | INTRAVENOUS | Status: DC
  Administered 2021-03-06: 3 mL via INTRAVENOUS

## 2021-03-06 MED ORDER — HYDRALAZINE HCL 20 MG/ML IJ SOLN: 10.0000 mg | INTRAMUSCULAR | Status: AC | PRN

## 2021-03-06 MED ORDER — NITROGLYCERIN 1 MG/10 ML FOR IR/CATH LAB
INTRA_ARTERIAL | Status: AC
  Filled 2021-03-06: qty 10

## 2021-03-06 MED ORDER — ONDANSETRON HCL 4 MG/2ML IJ SOLN
INTRAMUSCULAR | Status: DC | PRN
  Administered 2021-03-06: 4 mg via INTRAVENOUS

## 2021-03-06 MED ORDER — SODIUM CHLORIDE 0.9% FLUSH: 3.0000 mL | INTRAVENOUS | Status: DC | PRN

## 2021-03-06 MED ORDER — ONDANSETRON HCL 4 MG/2ML IJ SOLN
INTRAMUSCULAR | Status: AC
  Filled 2021-03-06: qty 2

## 2021-03-06 MED ORDER — FENTANYL CITRATE (PF) 100 MCG/2ML IJ SOLN
INTRAMUSCULAR | Status: DC | PRN
  Administered 2021-03-06: 25 ug via INTRAVENOUS

## 2021-03-06 MED ORDER — IOHEXOL 350 MG/ML SOLN
INTRAVENOUS | Status: DC | PRN
  Administered 2021-03-06: 130 mL via INTRA_ARTERIAL

## 2021-03-06 SURGICAL SUPPLY — 20 items
BALLN SAPPHIRE 2.0X12 (BALLOONS) ×2
BALLN SAPPHIRE ~~LOC~~ 2.75X12 (BALLOONS) ×1 IMPLANT
BALLOON SAPPHIRE 2.0X12 (BALLOONS) IMPLANT
CATH 5FR JL3.5 JR4 ANG PIG MP (CATHETERS) ×1 IMPLANT
CATH LAUNCHER 6FR EBU3.5 (CATHETERS) ×1 IMPLANT
GLIDESHEATH SLEND SS 6F .021 (SHEATH) ×1 IMPLANT
GUIDEWIRE INQWIRE 1.5J.035X260 (WIRE) ×1 IMPLANT
INQWIRE 1.5J .035X260CM (WIRE) ×2
KIT ENCORE 26 ADVANTAGE (KITS) ×1 IMPLANT
KIT HEART LEFT (KITS) ×2 IMPLANT
PACK CARDIAC CATHETERIZATION (CUSTOM PROCEDURE TRAY) ×2 IMPLANT
STENT SYNERGY XD 2.50X20 (Permanent Stent) ×1 IMPLANT
STENT SYNERGY XD 3.0X12 (Permanent Stent) IMPLANT
SYNERGY XD 2.50X20 (Permanent Stent) ×2 IMPLANT
SYNERGY XD 3.0X12 (Permanent Stent) ×2 IMPLANT
TRANSDUCER W/STOPCOCK (MISCELLANEOUS) ×2 IMPLANT
TUBING ART PRESS 72  MALE/FEM (TUBING) ×2
TUBING ART PRESS 72 MALE/FEM (TUBING) ×1 IMPLANT
TUBING CIL FLEX 10 FLL-RA (TUBING) ×2 IMPLANT
WIRE COUGAR XT STRL 190CM (WIRE) ×1 IMPLANT

## 2021-03-06 NOTE — Progress Notes (Signed)
Patient via bed to cath lab 

## 2021-03-06 NOTE — Plan of Care (Signed)

## 2021-03-06 NOTE — Progress Notes (Signed)
   03/06/21 1404  First Vascular Site Assessment  #1 - Location of Site Assessment Right radial  #1 - Vascular Site Assessment Scale Level 1  #1 - Hematoma present? No  #1 - Air in TR Band 12 cc  #1 - Dressing Status Intact   Update to Dr Excell Seltzer regarding inability to deflate TR band.  States OK to leave band an extended time. Ensure blood flow to fingers.  Could check with cath lab regarding placement

## 2021-03-06 NOTE — TOC Initial Note (Addendum)
Transition of Care [facility]) - Initial/Assessment Note    Patient Details  Name: Aaron Rosario MRN: [id number] Date of Birth: 1966-04-19  Transition of Care [facility]) CM/SW Contact:    Leone Haven, RN Phone Number: 03/06/2021, 3:00 PM  Clinical Narrative:                 NCM notified April that patient is here at [facility] and notifed the [place] notification line, [facility] [place]  , PCP is [facility] fax [phone]. CSW is [employer] pager [phone], phone [phone] ext [phone].  She is out of the office today ,so if need anything will  need to call Otho Perl. At [facility]. [phone] ext [phone].    Patient will be on brilinta.  TOC will fill the first 30 days free and then patient will need to pick up the refills at the [facility].  Once discharge is written will fax dc summary to [facility].         Patient Goals and CMS Choice        Expected Discharge Plan and Services                                                Prior Living Arrangements/Services                       Activities of Daily Living      Permission Sought/Granted                  Emotional Assessment              Admission diagnosis:  NSTEMI (non-ST elevated myocardial infarction) [facility]) [I21.4] Patient Active Problem List   Diagnosis Date Noted   NSTEMI (non-ST elevated myocardial infarction) (HCC) 03/05/2021   Hypersomnia, persistent    PCP:  Chauncey Mann, MD Pharmacy:   [facility] [address], [place] - [address] [place] [address] [place] [address] [place] [address] Phone: [phone] Fax: [phone]     Social Determinants of Health (SDOH) Interventions    Readmission Risk Interventions No flowsheet data found.

## 2021-03-06 NOTE — Progress Notes (Signed)
Removed 2 ccs of air from TR band at 1145.No bleeding at the time. Call from patient 5 minutes later that there is some oozing under the band. 2 ccs returned to the band and will wait another 30 mins

## 2021-03-06 NOTE — Progress Notes (Signed)
Progress Note  Patient Name: Aaron Rosario Date of Encounter: 03/06/2021  [facility] Cardiologist: Dr. Hillis Range  Subjective   Status post cardiac catheterization this morning, no chest pain  Inpatient Medications    Scheduled Meds:  aspirin EC  81 mg Oral Daily   atorvastatin  80 mg Oral Daily   busPIRone  10 mg Oral Daily   metoprolol tartrate  12.5 mg Oral BID   pantoprazole  40 mg Oral Daily   PARoxetine  40 mg Oral Daily   sodium chloride flush  3 mL Intravenous Q12H   sodium chloride flush  3 mL Intravenous Q12H   ticagrelor  90 mg Oral BID   Continuous Infusions:  sodium chloride     sodium chloride     PRN Meds: sodium chloride, acetaminophen, clonazePAM, hydrALAZINE, labetalol, nitroGLYCERIN, ondansetron (ZOFRAN) IV, sodium chloride flush   Vital Signs    Vitals:   03/06/21 0936 03/06/21 0941 03/06/21 0946 03/06/21 1002  BP: 117/74 123/74 118/71 110/71  Pulse: 92 64 60 61  Resp: (!) 6 (!) 4 (!) 8 14  Temp:    98.7 F (37.1 C)  TempSrc:    Oral  SpO2: 99% 99% 100% 99%  Weight:      Height:        Intake/Output Summary (Last 24 hours) at 03/06/2021 1117 Last data filed at 03/05/2021 1703 Gross per 24 hour  Intake 838.62 ml  Output --  Net 838.62 ml   Last 3 Weights 03/05/2021 03/04/2021 05/06/2018  Weight (lbs) 257 lb 8 oz 260 lb 260 lb  Weight (kg) 116.8 kg 117.935 kg 117.935 kg      Telemetry    Sinus rhythm- Personally Reviewed  ECG    Sinus bradycardia 55 without ST or T wave changes.- Personally Reviewed  Physical Exam   GEN: No acute distress.   Neck: No JVD Cardiac: RRR, no murmurs, rubs, or gallops.  Respiratory: Clear to auscultation bilaterally. GI: Soft, nontender, non-distended  MS: No edema; No deformity. Neuro:  Nonfocal  Psych: Normal affect   Labs    High Sensitivity Troponin:   Recent Labs  Lab 03/04/21 2058 03/04/21 2258 03/05/21 0650 03/05/21 0929  TROPONINIHS 20* 244* 5,433* 5,206*       Chemistry Recent Labs  Lab 03/04/21 2058 03/05/21 0650  NA 136 140  K 3.4* 4.7  CL 103 104  CO2 23 26  GLUCOSE 116* 116*  BUN 16 14  CREATININE 1.12 1.04  CALCIUM 9.8 9.3  PROT  --  6.5  ALBUMIN  --  3.9  AST  --  32  ALT  --  22  ALKPHOS  --  71  BILITOT  --  1.4*  GFRNONAA >60 >60  ANIONGAP 10 10     Hematology Recent Labs  Lab 03/05/21 0615 03/05/21 1358 03/06/21 0047  WBC 6.4 8.6 8.1  RBC 5.26 4.91 4.77  HGB 15.3 14.1 13.9  HCT 45.8 43.0 41.7  MCV 87.1 87.6 87.4  MCH 29.1 28.7 29.1  MCHC 33.4 32.8 33.3  RDW 13.3 13.4 13.3  PLT 261 263 247    BNP Recent Labs  Lab 03/05/21 0758  BNP 9.4     DDimer No results for input(s): DDIMER in the last 168 hours.   Radiology    CARDIAC CATHETERIZATION  Result Date: 03/06/2021  Prox RCA lesion is 40% stenosed.  Mid Cx to Dist Cx lesion is 95% stenosed.  Mid Cx lesion is 70% stenosed.  A drug-eluting stent was successfully placed using a SYNERGY XD 2.50X20.  Post intervention, there is a 0% residual stenosis.  A drug-eluting stent was successfully placed using a SYNERGY XD 3.0X12.  Post intervention, there is a 0% residual stenosis.  Mid RCA lesion is 30% stenosed.  1. 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 Successful PCI using a 3.0x12 mm Synergy DES in the mid circumflex, reducing 70% stenosis to 0%, and a 2.5x20 mm Synergy DES in the distal circumflex, reducing 95% stenosis to 0%. 3. Nonobstructive RCA and LAD plaquing without significant flow-obstructive stenoses 4. Normal LVEDP Recommend: DAPT with ASA and ticagrelor x 12 months (ACS Class 1 recommendation), aggressive risk reduction   DG Chest Portable 1 View  Result Date: 03/04/2021 CLINICAL DATA:  Chest pain EXAM: PORTABLE CHEST 1 VIEW COMPARISON:  Chest x-ray 04/05/2014 FINDINGS: The heart size and mediastinal contours are within  normal limits. Left base streaky/hazy airspace opacity. No focal consolidation. No pulmonary edema. No pleural effusion. No pneumothorax. No acute osseous abnormality. IMPRESSION: Peripheral left base airspace opacity that may represent infection/inflammation versus atelectasis. Followup PA and lateral chest X-ray is recommended in 3-4 weeks following therapy to ensure resolution. Electronically Signed   By: Tish Frederickson M.D.   On: 03/04/2021 21:38    Cardiac Studies   Cardiac catheterization/PCI and stent (03/06/2021)  Conclusion    Prox RCA lesion is 40% stenosed. Mid Cx to Dist Cx lesion is 95% stenosed. Mid Cx lesion is 70% stenosed. A drug-eluting stent was successfully placed using a SYNERGY XD 2.50X20. Post intervention, there is a 0% residual stenosis. A drug-eluting stent was successfully placed using a SYNERGY XD 3.0X12. Post intervention, there is a 0% residual stenosis. Mid RCA lesion is 30% stenosed.   1. 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 Successful PCI using a 3.0x12 mm Synergy DES in the mid circumflex, reducing 70% stenosis to 0%, and a 2.5x20 mm Synergy DES in the distal circumflex, reducing 95% stenosis to 0%. 3. Nonobstructive RCA and LAD plaquing without significant flow-obstructive stenoses 4. Normal LVEDP   Recommend: DAPT with ASA and ticagrelor x 12 months (ACS Class 1 recommendation), aggressive risk reduction .    Coronary Diagrams   Diagnostic Dominance: Right    Intervention       Patient Profile     55 y.o. male with no prior cardiac history other than family history was admitted on 03/04/2021 with non-STEMI.  Assessment & Plan    1: Non-STEMI-status post PCI and stenting x2 of the mid and distal AV groove circumflex by Dr. Excell Seltzer this morning without significant residual CAD.  He is on aspirin and Brilinta.  2:  Hyperlipidemia-LDL of 115 started on high-dose statin therapy  Patient had non-STEMI/PCI and drug-eluting stenting.  I anticipate discharge home tomorrow.  For questions or updates, please contact [facility] Please consult [email or web address] for contact info under        Signed, Nanetta Batty, MD  03/06/2021, 11:17 AM

## 2021-03-06 NOTE — Progress Notes (Signed)
Removed 1 cc of air from TR band at 1230 ans another 1 cc at 1255. Patient called at 1300 to report more bleeding. 2 ccs of air returned to TR band

## 2021-03-06 NOTE — Plan of Care (Signed)
  Problem: Education: Goal: Knowledge of General Education information will improve Description Including pain rating scale, medication(s)/side effects and non-pharmacologic comfort measures Outcome: Progressing   

## 2021-03-06 NOTE — Interval H&P Note (Signed)
Cath Lab Visit (complete for each Cath Lab visit)  Clinical Evaluation Leading to the Procedure:   ACS: Yes.    Non-ACS:    Anginal Classification: CCS IV  Anti-ischemic medical therapy: Minimal Therapy (1 class of medications)  Non-Invasive Test Results: No non-invasive testing performed  Prior CABG: No previous CABG      History and Physical Interval Note:  03/06/2021 8:35 AM  Aaron Rosario  has presented today for surgery, with the diagnosis of NSTEMI.  The various methods of treatment have been discussed with the patient and family. After consideration of risks, benefits and other options for treatment, the patient has consented to  Procedure(s): LEFT HEART CATH AND CORONARY ANGIOGRAPHY (N/A) as a surgical intervention.  The patient's history has been reviewed, patient examined, no change in status, stable for surgery.  I have reviewed the patient's chart and labs.  Questions were answered to the patient's satisfaction.     Tonny Bollman

## 2021-03-07 ENCOUNTER — Other Ambulatory Visit (HOSPITAL_COMMUNITY): Payer: Self-pay

## 2021-03-07 LAB — BASIC METABOLIC PANEL
Anion gap: 7 (ref 5–15)
BUN: 9 mg/dL (ref 6–20)
CO2: 24 mmol/L (ref 22–32)
Calcium: 8.7 mg/dL — ABNORMAL LOW (ref 8.9–10.3)
Chloride: 105 mmol/L (ref 98–111)
Creatinine, Ser: 1.04 mg/dL (ref 0.61–1.24)
GFR, Estimated: >60 mL/min (ref >60)
Glucose, Bld: 112 mg/dL — ABNORMAL HIGH (ref 70–99)
Potassium: 3.6 mmol/L (ref 3.5–5.1)
Sodium: 136 mmol/L (ref 135–145)

## 2021-03-07 LAB — CBC
HCT: 41.4 % (ref 39.0–52.0)
Hemoglobin: 13.7 g/dL (ref 13.0–17.0)
MCH: 29 pg (ref 26.0–34.0)
MCHC: 33.1 g/dL (ref 30.0–36.0)
MCV: 87.5 fL (ref 80.0–100.0)
Platelets: 227 10*3/uL (ref 150–400)
RBC: 4.73 MIL/uL (ref 4.22–5.81)
RDW: 13.2 % (ref 11.5–15.5)
WBC: 7.4 10*3/uL (ref 4.0–10.5)
nRBC: 0 % (ref 0.0–0.2)

## 2021-03-07 MED ORDER — TICAGRELOR 90 MG PO TABS
90.0000 mg | ORAL_TABLET | Freq: Two times a day (BID) | ORAL | 11 refills | Status: DC
  Filled 2021-03-07: qty 60, 30d supply, fill #0

## 2021-03-07 MED ORDER — NITROGLYCERIN 0.4 MG SL SUBL
0.4000 mg | SUBLINGUAL_TABLET | SUBLINGUAL | 1 refills | Status: AC | PRN
  Filled 2021-03-07: qty 25, 7d supply, fill #0

## 2021-03-07 MED ORDER — ATORVASTATIN CALCIUM 80 MG PO TABS
80.0000 mg | ORAL_TABLET | Freq: Every day | ORAL | 11 refills | Status: AC
  Filled 2021-03-07: qty 30, 30d supply, fill #0

## 2021-03-07 MED ORDER — ASPIRIN 81 MG PO TBEC
81.0000 mg | DELAYED_RELEASE_TABLET | Freq: Every day | ORAL | 11 refills | Status: AC
  Filled 2021-03-07: qty 30, 30d supply, fill #0

## 2021-03-07 MED ORDER — METOPROLOL TARTRATE 25 MG PO TABS
12.5000 mg | ORAL_TABLET | Freq: Two times a day (BID) | ORAL | 11 refills | Status: DC
  Filled 2021-03-07: qty 30, 30d supply, fill #0

## 2021-03-07 MED FILL — Ondansetron HCl Inj 4 MG/2ML (2 MG/ML): INTRAMUSCULAR | Qty: 2 | Status: AC

## 2021-03-07 NOTE — Progress Notes (Signed)
CARDIAC REHAB PHASE I   PRE:  Rate/Rhythm: 66 SR    BP: sitting 109/81    SaO2: 94 RA  MODE:  Ambulation: 340 ft   POST:  Rate/Rhythm: 82 SR  Tolerated walk well, no c/o. Long discussion of MI, stent, Brilinta, restrictions, diet, exercise, NTG and CRPII. Pt has stress and on the road often. Discussed management. Also has hip calcification that causes pain with excessive walking. Recommended pt trying his stationary bike for exercise. Will refer to [facility] CRPII. Girlfriend supportive.  [phone]   Harriet Masson CES, ACSM 03/07/2021 10:50 AM

## 2021-03-07 NOTE — Progress Notes (Signed)
Progress Note  Patient Name: Aaron Rosario Date of Encounter: 6/Denyse Amass14/2022  [facility] Cardiologist: Dr. Hillis RangeJames Allred  Subjective   Status post cardiac catheterization this morning, no chest pain  Inpatient Medications    Scheduled Meds:  aspirin EC  81 mg Oral Daily   atorvastatin  80 mg Oral Daily   busPIRone  10 mg Oral Daily   metoprolol tartrate  12.5 mg Oral BID   pantoprazole  40 mg Oral Daily   PARoxetine  40 mg Oral Daily   sodium chloride flush  3 mL Intravenous Q12H   sodium chloride flush  3 mL Intravenous Q12H   ticagrelor  90 mg Oral BID   Continuous Infusions:  sodium chloride     PRN Meds: sodium chloride, acetaminophen, clonazePAM, nitroGLYCERIN, ondansetron (ZOFRAN) IV, sodium chloride flush   Vital Signs    Vitals:   03/06/21 1932 03/07/21 0000 03/07/21 0419 03/07/21 0727  BP: 117/83 117/78 121/64 (!) 133/92  Pulse: 62 70 66 65  Resp: 14 20 19 19   Temp: 99.1 F (37.3 C) 99 F (37.2 C) 98.9 F (37.2 C) 98.7 F (37.1 C)  TempSrc: Oral Oral Oral Oral  SpO2: 95% 95% 94% 92%  Weight:      Height:        Intake/Output Summary (Last 24 hours) at 03/07/2021 1000 Last data filed at 03/07/2021 0400 Gross per 24 hour  Intake 0 ml  Output --  Net 0 ml   Last 3 Weights 03/05/2021 03/04/2021 05/06/2018  Weight (lbs) 257 lb 8 oz 260 lb 260 lb  Weight (kg) 116.8 kg 117.935 kg 117.935 kg      Telemetry    Sinus rhythm- Personally Reviewed  ECG    Sinus bradycardia 55 without ST or T wave changes.- Personally Reviewed  Physical Exam   GEN: No acute distress.   Neck: No JVD Cardiac: RRR, no murmurs, rubs, or gallops.  Respiratory: Clear to auscultation bilaterally. GI: Soft, nontender, non-distended  MS: No edema; No deformity. Neuro:  Nonfocal  Psych: Normal affect   Labs    High Sensitivity Troponin:   Recent Labs  Lab 03/04/21 2058 03/04/21 2258 03/05/21 0650 03/05/21 0929  TROPONINIHS 20* 244* 5,433* 5,206*       Chemistry Recent Labs  Lab 03/04/21 2058 03/05/21 0650 03/07/21 0030  NA 136 140 136  K 3.4* 4.7 3.6  CL 103 104 105  CO2 23 26 24   GLUCOSE 116* 116* 112*  BUN 16 14 9   CREATININE 1.12 1.04 1.04  CALCIUM 9.8 9.3 8.7*  PROT  --  6.5  --   ALBUMIN  --  3.9  --   AST  --  32  --   ALT  --  22  --   ALKPHOS  --  71  --   BILITOT  --  1.4*  --   GFRNONAA >60 >60 >60  ANIONGAP 10 10 7      Hematology Recent Labs  Lab 03/05/21 1358 03/06/21 0047 03/07/21 0030  WBC 8.6 8.1 7.4  RBC 4.91 4.77 4.73  HGB 14.1 13.9 13.7  HCT 43.0 41.7 41.4  MCV 87.6 87.4 87.5  MCH 28.7 29.1 29.0  MCHC 32.8 33.3 33.1  RDW 13.4 13.3 13.2  PLT 263 247 227    BNP Recent Labs  Lab 03/05/21 0758  BNP 9.4     DDimer No results for input(s): DDIMER in the last 168 hours.   Radiology    CARDIAC CATHETERIZATION  Result Date: 03/06/2021  Prox RCA lesion is 40% stenosed.  Mid Cx to Dist Cx lesion is 95% stenosed.  Mid Cx lesion is 70% stenosed.  A drug-eluting stent was successfully placed using a SYNERGY XD 2.50X20.  Post intervention, there is a 0% residual stenosis.  A drug-eluting stent was successfully placed using a SYNERGY XD 3.0X12.  Post intervention, there is a 0% residual stenosis.  Mid RCA lesion is 30% stenosed.  1. 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 Successful PCI using a 3.0x12 mm Synergy DES in the mid circumflex, reducing 70% stenosis to 0%, and a 2.5x20 mm Synergy DES in the distal circumflex, reducing 95% stenosis to 0%. 3. Nonobstructive RCA and LAD plaquing without significant flow-obstructive stenoses 4. Normal LVEDP Recommend: DAPT with ASA and ticagrelor x 12 months (ACS Class 1 recommendation), aggressive risk reduction   ECHOCARDIOGRAM COMPLETE  Result Date: 03/06/2021    ECHOCARDIOGRAM REPORT   Patient Name:   Aaron Rosario Date of Exam:  03/06/2021 Medical Rec #:  [id number]          Height:       69.0 in Accession #:    [id number]         Weight:       257.5 lb Date of Birth:  Nov 16, 1965         BSA:          2.300 m Patient Age:    54 years           BP:           117/65 mmHg Patient Gender: M                  HR:           62 bpm. Exam Location:  Inpatient Procedure: 2D Echo, Cardiac Doppler and Color Doppler Indications:    Abnormal ECG  History:        Patient has no prior history of Echocardiogram examinations.  Sonographer:    Neomia Dear RDCS Referring Phys: [id number] MUHAMMAD S KHAN IMPRESSIONS  1. Left ventricular ejection fraction, by estimation, is 55 to 60%. The left ventricle has normal function. The left ventricle has no regional wall motion abnormalities. There is moderate left ventricular hypertrophy. Left ventricular diastolic parameters were normal.  2. Right ventricular systolic function is normal. The right ventricular size is normal. There is normal pulmonary artery systolic pressure. The estimated right ventricular systolic pressure is 25.1 mmHg.  3. The mitral valve is normal in structure. No evidence of mitral valve regurgitation.  4. The aortic valve is tricuspid. Aortic valve regurgitation is not visualized. No aortic stenosis is present.  5. The inferior vena cava is normal in size with greater than 50% respiratory variability, suggesting right atrial pressure of 3 mmHg. FINDINGS  Left Ventricle: Left ventricular ejection fraction, by estimation, is 55 to 60%. The left ventricle has normal function. The left ventricle has no regional wall motion abnormalities. The left ventricular internal cavity size was normal in size. There is  moderate left ventricular hypertrophy. Left ventricular diastolic parameters were normal. Right Ventricle: The right ventricular size is normal. No increase in right ventricular wall thickness. Right ventricular systolic function is normal. There is normal pulmonary artery systolic pressure. The  tricuspid regurgitant velocity is 2.35 m/s, and  with an assumed right atrial pressure of 3 mmHg, the  estimated right ventricular systolic pressure is 25.1 mmHg. Left Atrium: Left atrial size was normal in size. Right Atrium: Right atrial size was normal in size. Pericardium: Trivial pericardial effusion is present. Mitral Valve: The mitral valve is normal in structure. No evidence of mitral valve regurgitation. Tricuspid Valve: The tricuspid valve is normal in structure. Tricuspid valve regurgitation is trivial. Aortic Valve: The aortic valve is tricuspid. Aortic valve regurgitation is not visualized. No aortic stenosis is present. Aortic valve mean gradient measures 3.0 mmHg. Aortic valve peak gradient measures 5.7 mmHg. Aortic valve area, by VTI measures 2.00 cm. Pulmonic Valve: The pulmonic valve was not well visualized. Pulmonic valve regurgitation is not visualized. Aorta: The aortic root and ascending aorta are structurally normal, with no evidence of dilitation. Venous: The inferior vena cava is normal in size with greater than 50% respiratory variability, suggesting right atrial pressure of 3 mmHg. IAS/Shunts: The interatrial septum was not well visualized.  LEFT VENTRICLE PLAX 2D LVIDd:         4.80 cm     Diastology LVIDs:         2.70 cm     LV e' medial:    7.83 cm/s LV PW:         1.40 cm     LV E/e' medial:  9.2 LV IVS:        1.20 cm     LV e' lateral:   9.46 cm/s LVOT diam:     2.10 cm     LV E/e' lateral: 7.6 LV SV:         48 LV SV Index:   21 LVOT Area:     3.46 cm  LV Volumes (MOD) LV vol d, MOD A2C: 85.9 ml LV vol d, MOD A4C: 98.9 ml LV vol s, MOD A2C: 46.3 ml LV vol s, MOD A4C: 48.7 ml LV SV MOD A2C:     39.6 ml LV SV MOD A4C:     98.9 ml LV SV MOD BP:      47.4 ml RIGHT VENTRICLE RV Basal diam:  3.30 cm RV Mid diam:    2.70 cm RV S prime:     14.60 cm/s TAPSE (M-mode): 1.6 cm LEFT ATRIUM             Index       RIGHT ATRIUM           Index LA diam:        2.90 cm 1.26 cm/m  RA Area:      11.40 cm LA Vol (A2C):   23.7 ml 10.30 ml/m RA Volume:   25.90 ml  11.26 ml/m LA Vol (A4C):   22.8 ml 9.91 ml/m LA Biplane Vol: 24.8 ml 10.78 ml/m  AORTIC VALVE                   PULMONIC VALVE AV Area (Vmax):    2.07 cm    PV Vmax:       0.81 m/s AV Area (Vmean):   1.94 cm    PV Vmean:      60.900 cm/s AV Area (VTI):     2.00 cm    PV VTI:        0.194 m AV Vmax:           119.00 cm/s PV Peak grad:  2.6 mmHg AV Vmean:          83.900 cm/s PV Mean grad:  2.0 mmHg AV VTI:  0.239 m AV Peak Grad:      5.7 mmHg AV Mean Grad:      3.0 mmHg LVOT Vmax:         71.20 cm/s LVOT Vmean:        47.100 cm/s LVOT VTI:          0.138 m LVOT/AV VTI ratio: 0.58  AORTA Ao Root diam: 3.50 cm Ao Asc diam:  3.00 cm MITRAL VALVE               TRICUSPID VALVE MV Area (PHT): 4.89 cm    TR Peak grad:   22.1 mmHg MV Decel Time: 155 msec    TR Vmax:        235.00 cm/s MV E velocity: 72.20 cm/s MV A velocity: 55.80 cm/s  SHUNTS MV E/A ratio:  1.29        Systemic VTI:  0.14 m                            Systemic Diam: 2.10 cm Epifanio Lesches MD Electronically signed by Epifanio Lesches MD Signature Date/Time: 03/06/2021/3:27:20 PM    Final     Cardiac Studies   Cardiac catheterization/PCI and stent (03/06/2021)  Conclusion    Prox RCA lesion is 40% stenosed. Mid Cx to Dist Cx lesion is 95% stenosed. Mid Cx lesion is 70% stenosed. A drug-eluting stent was successfully placed using a SYNERGY XD 2.50X20. Post intervention, there is a 0% residual stenosis. A drug-eluting stent was successfully placed using a SYNERGY XD 3.0X12. Post intervention, there is a 0% residual stenosis. Mid RCA lesion is 30% stenosed.   1. 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 Successful PCI using a 3.0x12 mm Synergy DES in the mid circumflex, reducing 70% stenosis to 0%, and a 2.5x20 mm Synergy DES in the distal  circumflex, reducing 95% stenosis to 0%. 3. Nonobstructive RCA and LAD plaquing without significant flow-obstructive stenoses 4. Normal LVEDP   Recommend: DAPT with ASA and ticagrelor x 12 months (ACS Class 1 recommendation), aggressive risk reduction .    Coronary Diagrams   Diagnostic Dominance: Right    Intervention       Patient Profile     55 y.o. male with no prior cardiac history other than family history was admitted on 03/04/2021 with non-STEMI.  Assessment & Plan    1: Non-STEMI-troponin went up to 5200.  Status post PCI and stenting x2 of the mid and distal AV groove circumflex by Dr. Excell Seltzer this morning without significant residual CAD.  He is on aspirin and Brilinta.  2: Hyperlipidemia-LDL of 115 started on high-dose statin therapy  Patient had non-STEMI/PCI and drug-eluting stenting.  Okay for discharge home today, TOC 7 then follow-up with 1 of [place].   For questions or updates, please contact [facility] Please consult [email or web address] for contact info under        Signed, Nanetta Batty, MD  03/07/2021, 10:00 AM

## 2021-03-07 NOTE — Progress Notes (Signed)
Discharge instructions reviewed with patient and girlfriend Aaron Rosario. Both verbalize understanding and had an opportunity to ask questions,. Patient via wheelchair to Aaron Rosario's waiting car in stable condition.

## 2021-03-07 NOTE — Plan of Care (Signed)

## 2021-03-07 NOTE — Discharge Summary (Addendum)
Discharge Summary    Patient ID: EMMIT ORILEY MRN: [id number]; DOB: 1966/09/03  Admit date: 03/04/2021 Discharge date: 03/07/2021  PCP:  Chauncey Mann, MD   [facility] Providers Cardiologist:  Nanetta Batty, MD   {   Discharge Diagnoses    Principal Problem:   NSTEMI (non-ST elevated myocardial infarction) [facility])   CAD   HLD  Diagnostic Studies/Procedures    Cardiac catheterization/PCI and stent (03/06/2021)   Conclusion     Prox RCA lesion is 40% stenosed. Mid Cx to Dist Cx lesion is 95% stenosed. Mid Cx lesion is 70% stenosed. A drug-eluting stent was successfully placed using a SYNERGY XD 2.50X20. Post intervention, there is a 0% residual stenosis. A drug-eluting stent was successfully placed using a SYNERGY XD 3.0X12. Post intervention, there is a 0% residual stenosis. Mid RCA lesion is 30% stenosed.   1. 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 Successful PCI using a 3.0x12 mm Synergy DES in the mid circumflex, reducing 70% stenosis to 0%, and a 2.5x20 mm Synergy DES in the distal circumflex, reducing 95% stenosis to 0%. 3. Nonobstructive RCA and LAD plaquing without significant flow-obstructive stenoses 4. Normal LVEDP   Recommend: DAPT with ASA and ticagrelor x 12 months (ACS Class 1 recommendation), aggressive risk reduction .      Coronary Diagrams     Diagnostic Dominance: Right      Intervention         Echo 03/06/21 1. Left ventricular ejection fraction, by estimation, is 55 to 60%. The  left ventricle has normal function. The left ventricle has no regional  wall motion abnormalities. There is moderate left ventricular hypertrophy.  Left ventricular diastolic  parameters were normal.   2. Right ventricular systolic function is normal. The right ventricular  size is normal. There is normal pulmonary artery systolic  pressure. The  estimated right ventricular systolic pressure is 25.1 mmHg.   3. The mitral valve is normal in structure. No evidence of mitral valve  regurgitation.   4. The aortic valve is tricuspid. Aortic valve regurgitation is not  visualized. No aortic stenosis is present.   5. The inferior vena cava is normal in size with greater than 50%  respiratory variability, suggesting right atrial pressure of 3 mmHg.    History of Present Illness     55 y.o. male  with pmh of anxiety and GERD who is being seen 03/05/2021 for the evaluation of NSTEMI.   He states he had chest pressure while driving which led to him coming to OSH ED, where his troponin rose from 20s to 200s. No significant EKG changes. ASA and heparin were given. [facility] was called for NSTEMI management.  CXR shows some atelectasis. He notes that he had 2 LHC done long time ago for some atypical CP; they were normal. However states this time, pain pressure was more. Only takes medications for anxiety at home. Non-smoker.   Hospital Course     Consultants: None  NSTEMI - Hs-troponin peaked at 5433. Treated with heparin. Cath showed Severe single vessel CAD involving the mid and distal circumflex, with critical stenosis of the distal circumflex as the suspected culprit lesion for ACS. S/p Successful PCI using a 3.0x12 mm Synergy DES in the mid circumflex and a 2.5x20 mm Synergy DES in the distal circumflex. Medical therapy for other non obstructive CAD. - Echo with preserved LVEF -  Ambulated well. No recurrent pain.  - Continue ASA, Brilinta, Metoprolol and statin   2. HLD -03/06/2021: Cholesterol 174; HDL 33; LDL Cholesterol 115; Triglycerides 131; VLDL 26  - Continue statin - Consider OP f/u labs 6-8 weeks given statin initiation this admission.   Did the patient have an acute coronary syndrome (MI, NSTEMI, STEMI, etc) this admission?:  Yes                               AHA/ACC Clinical Performance & Quality  Measures: Aspirin prescribed? - Yes ADP Receptor Inhibitor (Plavix/Clopidogrel, Brilinta/Ticagrelor or Effient/Prasugrel) prescribed (includes medically managed patients)? - Yes Beta Blocker prescribed? - Yes High Intensity Statin (Lipitor 40-80mg  or Crestor 20-40mg ) prescribed? - Yes EF assessed during THIS hospitalization? - Yes For EF <40%, was ACEI/ARB prescribed? - Not Applicable (EF >/= 40%) For EF <40%, Aldosterone Antagonist (Spironolactone or Eplerenone) prescribed? - Not Applicable (EF >/= 40%) Cardiac Rehab Phase II ordered (including medically managed patients)? - Yes      _____________  Discharge Vitals Blood pressure (!) 133/92, pulse 65, temperature 98.7 F (37.1 C), temperature source Oral, resp. rate 19, height 5\' 9"  (1.753 m), weight 116.8 kg, SpO2 92 %.  Filed Weights   03/04/21 2048 03/05/21 0558  Weight: 117.9 kg 116.8 kg    Labs & Radiologic Studies    CBC Recent Labs    03/04/21 2058 03/05/21 0615 03/06/21 0047 03/07/21 0030  WBC 7.2   < > 8.1 7.4  NEUTROABS 4.7  --   --   --   HGB 16.6   < > 13.9 13.7  HCT 48.9   < > 41.7 41.4  MCV 87.2   < > 87.4 87.5  PLT 294   < > 247 227   < > = values in this interval not displayed.   Basic Metabolic Panel Recent Labs    03/09/21 0650 03/07/21 0030  NA 140 136  K 4.7 3.6  CL 104 105  CO2 26 24  GLUCOSE 116* 112*  BUN 14 9  CREATININE 1.04 1.04  CALCIUM 9.3 8.7*   Liver Function Tests Recent Labs    03/05/21 0650  AST 32  ALT 22  ALKPHOS 71  BILITOT 1.4*  PROT 6.5  ALBUMIN 3.9   High Sensitivity Troponin:   Recent Labs  Lab 03/04/21 2058 03/04/21 2258 03/05/21 0650 03/05/21 0929  TROPONINIHS 20* 244* 5,433* 5,206*     Hemoglobin A1C Recent Labs    03/05/21 0630  HGBA1C 5.6   Fasting Lipid Panel Recent Labs    03/06/21 0047  CHOL 174  HDL 33*  LDLCALC 115*  TRIG 131  CHOLHDL 5.3    _____________  CARDIAC CATHETERIZATION  Result Date: 03/06/2021  Prox RCA lesion is  40% stenosed.  Mid Cx to Dist Cx lesion is 95% stenosed.  Mid Cx lesion is 70% stenosed.  A drug-eluting stent was successfully placed using a SYNERGY XD 2.50X20.  Post intervention, there is a 0% residual stenosis.  A drug-eluting stent was successfully placed using a SYNERGY XD 3.0X12.  Post intervention, there is a 0% residual stenosis.  Mid RCA lesion is 30% stenosed.  1. 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 Successful PCI using a 3.0x12 mm Synergy DES in the mid circumflex, reducing 70% stenosis to 0%, and a 2.5x20 mm  Synergy DES in the distal circumflex, reducing 95% stenosis to 0%. 3. Nonobstructive RCA and LAD plaquing without significant flow-obstructive stenoses 4. Normal LVEDP Recommend: DAPT with ASA and ticagrelor x 12 months (ACS Class 1 recommendation), aggressive risk reduction   DG Chest Portable 1 View  Result Date: 03/04/2021 CLINICAL DATA:  Chest pain EXAM: PORTABLE CHEST 1 VIEW COMPARISON:  Chest x-ray 04/05/2014 FINDINGS: The heart size and mediastinal contours are within normal limits. Left base streaky/hazy airspace opacity. No focal consolidation. No pulmonary edema. No pleural effusion. No pneumothorax. No acute osseous abnormality. IMPRESSION: Peripheral left base airspace opacity that may represent infection/inflammation versus atelectasis. Followup PA and lateral chest X-ray is recommended in 3-4 weeks following therapy to ensure resolution. Electronically Signed   By: Tish FredericksonMorgane  Naveau M.D.   On: 03/04/2021 21:38   ECHOCARDIOGRAM COMPLETE  Result Date: 03/06/2021    ECHOCARDIOGRAM REPORT   Patient Name:   Tera PartridgeMOTHY T Gerardo Date of Exam: 03/06/2021 Medical Rec #:  [id number]          Height:       69.0 in Accession #:    [id number]         Weight:       257.5 lb Date of Birth:  03-Oct-1965         BSA:          2.300 m Patient Age:    54 years           BP:            117/65 mmHg Patient Gender: M                  HR:           62 bpm. Exam Location:  Inpatient Procedure: 2D Echo, Cardiac Doppler and Color Doppler Indications:    Abnormal ECG  History:        Patient has no prior history of Echocardiogram examinations.  Sonographer:    Neomia DearAMARA CROWN RDCS Referring Phys: [id number] MUHAMMAD S KHAN IMPRESSIONS  1. Left ventricular ejection fraction, by estimation, is 55 to 60%. The left ventricle has normal function. The left ventricle has no regional wall motion abnormalities. There is moderate left ventricular hypertrophy. Left ventricular diastolic parameters were normal.  2. Right ventricular systolic function is normal. The right ventricular size is normal. There is normal pulmonary artery systolic pressure. The estimated right ventricular systolic pressure is 25.1 mmHg.  3. The mitral valve is normal in structure. No evidence of mitral valve regurgitation.  4. The aortic valve is tricuspid. Aortic valve regurgitation is not visualized. No aortic stenosis is present.  5. The inferior vena cava is normal in size with greater than 50% respiratory variability, suggesting right atrial pressure of 3 mmHg. FINDINGS  Left Ventricle: Left ventricular ejection fraction, by estimation, is 55 to 60%. The left ventricle has normal function. The left ventricle has no regional wall motion abnormalities. The left ventricular internal cavity size was normal in size. There is  moderate left ventricular hypertrophy. Left ventricular diastolic parameters were normal. Right Ventricle: The right ventricular size is normal. No increase in right ventricular wall thickness. Right ventricular systolic function is normal. There is normal pulmonary artery systolic pressure. The tricuspid regurgitant velocity is 2.35 m/s, and  with an assumed right atrial pressure of 3 mmHg, the estimated right ventricular systolic pressure is 25.1 mmHg. Left Atrium: Left atrial size was normal in size. Right  Atrium: Right atrial size was normal in  size. Pericardium: Trivial pericardial effusion is present. Mitral Valve: The mitral valve is normal in structure. No evidence of mitral valve regurgitation. Tricuspid Valve: The tricuspid valve is normal in structure. Tricuspid valve regurgitation is trivial. Aortic Valve: The aortic valve is tricuspid. Aortic valve regurgitation is not visualized. No aortic stenosis is present. Aortic valve mean gradient measures 3.0 mmHg. Aortic valve peak gradient measures 5.7 mmHg. Aortic valve area, by VTI measures 2.00 cm. Pulmonic Valve: The pulmonic valve was not well visualized. Pulmonic valve regurgitation is not visualized. Aorta: The aortic root and ascending aorta are structurally normal, with no evidence of dilitation. Venous: The inferior vena cava is normal in size with greater than 50% respiratory variability, suggesting right atrial pressure of 3 mmHg. IAS/Shunts: The interatrial septum was not well visualized.  LEFT VENTRICLE PLAX 2D LVIDd:         4.80 cm     Diastology LVIDs:         2.70 cm     LV e' medial:    7.83 cm/s LV PW:         1.40 cm     LV E/e' medial:  9.2 LV IVS:        1.20 cm     LV e' lateral:   9.46 cm/s LVOT diam:     2.10 cm     LV E/e' lateral: 7.6 LV SV:         48 LV SV Index:   21 LVOT Area:     3.46 cm  LV Volumes (MOD) LV vol d, MOD A2C: 85.9 ml LV vol d, MOD A4C: 98.9 ml LV vol s, MOD A2C: 46.3 ml LV vol s, MOD A4C: 48.7 ml LV SV MOD A2C:     39.6 ml LV SV MOD A4C:     98.9 ml LV SV MOD BP:      47.4 ml RIGHT VENTRICLE RV Basal diam:  3.30 cm RV Mid diam:    2.70 cm RV S prime:     14.60 cm/s TAPSE (M-mode): 1.6 cm LEFT ATRIUM             Index       RIGHT ATRIUM           Index LA diam:        2.90 cm 1.26 cm/m  RA Area:     11.40 cm LA Vol (A2C):   23.7 ml 10.30 ml/m RA Volume:   25.90 ml  11.26 ml/m LA Vol (A4C):   22.8 ml 9.91 ml/m LA Biplane Vol: 24.8 ml 10.78 ml/m  AORTIC VALVE                   PULMONIC VALVE AV Area (Vmax):     2.07 cm    PV Vmax:       0.81 m/s AV Area (Vmean):   1.94 cm    PV Vmean:      60.900 cm/s AV Area (VTI):     2.00 cm    PV VTI:        0.194 m AV Vmax:           119.00 cm/s PV Peak grad:  2.6 mmHg AV Vmean:          83.900 cm/s PV Mean grad:  2.0 mmHg AV VTI:            0.239 m AV Peak Grad:      5.7 mmHg AV Mean Grad:  3.0 mmHg LVOT Vmax:         71.20 cm/s LVOT Vmean:        47.100 cm/s LVOT VTI:          0.138 m LVOT/AV VTI ratio: 0.58  AORTA Ao Root diam: 3.50 cm Ao Asc diam:  3.00 cm MITRAL VALVE               TRICUSPID VALVE MV Area (PHT): 4.89 cm    TR Peak grad:   22.1 mmHg MV Decel Time: 155 msec    TR Vmax:        235.00 cm/s MV E velocity: 72.20 cm/s MV A velocity: 55.80 cm/s  SHUNTS MV E/A ratio:  1.29        Systemic VTI:  0.14 m                            Systemic Diam: 2.10 cm Epifanio Lesches MD Electronically signed by Epifanio Lesches MD Signature Date/Time: 03/06/2021/3:27:20 PM    Final    Disposition   Pt is being discharged home today in good condition.  Follow-up Plans & Appointments     Follow-up Information     Ronney Asters, NP Follow up on 04/24/2021.   Specialty: Cardiology Why: @ 11:15am for hospital follow up with Dr. Hazle Coca PA/NP Contact information: [address] [place] [address] [phone]                Discharge Instructions     Amb Referral to Cardiac Rehabilitation   Complete by: As directed    Diagnosis:  Coronary Stents NSTEMI PTCA     After initial evaluation and assessments completed: Virtual Based Care may be provided alone or in conjunction with Phase 2 Cardiac Rehab based on patient barriers.: Yes   Diet - low sodium heart healthy   Complete by: As directed    Discharge instructions   Complete by: As directed    NO HEAVY LIFTING (>10lbs) X 2 WEEKS. NO SEXUAL ACTIVITY X 2 WEEKS. NO DRIVING X 1 WEEK. NO SOAKING BATHS, HOT TUBS, POOLS, ETC., X 7 DAYS.   Increase activity slowly   Complete by:  As directed        Discharge Medications   Allergies as of 03/07/2021   No Known Allergies      Medication List     STOP taking these medications    meloxicam 7.5 MG tablet Commonly known as: MOBIC       TAKE these medications    aspirin 81 MG EC tablet Take 1 tablet (81 mg total) by mouth daily. Swallow whole. Start taking on: March 08, 2021   atorvastatin 80 MG tablet Commonly known as: LIPITOR Take 1 tablet (80 mg total) by mouth daily. Start taking on: March 08, 2021   busPIRone 10 MG tablet Commonly known as: BUSPAR Take 10 mg by mouth 2 (two) times daily.   clonazePAM 1 MG tablet Commonly known as: KLONOPIN Take 1 mg by mouth 2 (two) times daily as needed for anxiety.   metoprolol tartrate 25 MG tablet Commonly known as: LOPRESSOR Take 0.5 tablets (12.5 mg total) by mouth 2 (two) times daily.   nitroGLYCERIN 0.4 MG SL tablet Commonly known as: NITROSTAT Place 1 tablet (0.4 mg total) under the tongue every 5 (five) minutes x 3 doses as needed for chest pain.   omeprazole 40 MG capsule Commonly known as: PRILOSEC Take  40 mg by mouth daily.   PARoxetine 40 MG tablet Commonly known as: PAXIL Take 40 mg by mouth daily.   ticagrelor 90 MG Tabs tablet Commonly known as: BRILINTA Take 1 tablet (90 mg total) by mouth 2 (two) times daily.        Outstanding Labs/Studies   Consider OP f/u labs 6-8 weeks given statin initiation this admission.   Duration of Discharge Encounter   Greater than 30 minutes including physician time.  SignedManson Passey, PA 03/07/2021, 11:36 AM\  Agree with note by Chelsea Aus PA-C  Stable for discharge status post staged AV groove circumflex intervention by Dr. Eldridge Dace.  Radial site intact.  Patient asymptomatic.  Database reviewed.  Okay to discharge home.  TOC 7 followed by return office visit with me.  Runell Gess, M.D., FACP, [facility], [facility] [address] [place], [place]  [address]  [phone] 03/07/2021 11:46 AM

## 2021-03-15 ENCOUNTER — Other Ambulatory Visit (HOSPITAL_COMMUNITY): Payer: Self-pay

## 2021-03-15 ENCOUNTER — Telehealth (HOSPITAL_COMMUNITY): Payer: Self-pay

## 2021-03-15 NOTE — Telephone Encounter (Signed)
Pharmacy Transitions of Care Follow-up Telephone Call  Date of discharge: 03/07/21  Discharge Diagnosis: NSTEMI  How have you been since you were released from the hospital?  Patient has been doing well. Already seen PCP at [facility] since discharge.  Medication changes made at discharge:           START taking: Aspirin Low Dose (aspirin)  atorvastatin (LIPITOR)  Brilinta (ticagrelor)  metoprolol tartrate (LOPRESSOR)  nitroGLYCERIN (NITROSTAT)   STOP taking: meloxicam 7.5 MG tablet (MOBIC)   Medication changes verified by the patient? Yes    Medication Accessibility:  [facility]  Was the patient provided with refills on discharged medications? yes   Have all prescriptions been transferred from [facility] to home pharmacy?  No -can't transfer  Is the patient able to afford medications? Patient has [employer] and [employer]    Medication Review: TICAGRELOR (BRILINTA) Ticagrelor 90 mg BID initiated on 03/07/21.  - Educated patient on expected duration of therapy of aspirin with ticagrelor. - Discussed importance of taking medication around the same time every day - Reviewed potential DDIs with patient - Advised patient of medications to avoid (NSAIDs, aspirin maintenance doses>100 mg daily) - Educated that Tylenol (acetaminophen) will be the preferred analgesic to prevent risk of bleeding  - Emphasized importance of monitoring for signs and symptoms of bleeding (abnormal bruising, prolonged bleeding, nose bleeds, bleeding from gums, discolored urine, black tarry stools)  - Educated patient to notify doctor if shortness of breath or abnormal heartbeat occur - Advised patient to alert all providers of antiplatelet therapy prior to starting a new medication or having a procedure    Follow-up Appointments:  PCP Hospital f/u appt confirmed? Had follow up this week with PCP at [facility] so new meds with refills are already in [facility] system  [facility] f/u appt confirmed? 04/24/21 Cardiology  with Dr. Molli Hazard  If their condition worsens, is the pt aware to call PCP or go to the Emergency Dept.? yes  Final Patient Assessment: Patient doing well, refills at home pharmacy and has follow ups scheduled

## 2021-03-29 ENCOUNTER — Other Ambulatory Visit (HOSPITAL_COMMUNITY): Payer: Self-pay

## 2021-04-23 NOTE — Progress Notes (Deleted)
Cardiology Clinic Note   Patient Name: Aaron Rosario Date of Encounter: 04/23/2021  Primary Care Provider:  Chauncey Mann, MD Primary Cardiologist:  Nanetta Batty, MD  Patient Profile    Aaron Rosario 55 year old male presents to the clinic today for follow-up evaluation status post NSTEMI.  Underwent cardiac catheterization 03/06/2021 with PCI and DES x2 to his mid circumflex and distal circumflex.  Past Medical History    Past Medical History:  Diagnosis Date   Anxiety    Hx of cardiac cath    x2, no stents    Hypersomnia, persistent    Panic attacks    Past Surgical History:  Procedure Laterality Date   CHOLECYSTECTOMY     CORONARY STENT INTERVENTION N/A 03/06/2021   Procedure: CORONARY STENT INTERVENTION;  Surgeon: Tonny Bollman, MD;  Location: [facility] INVASIVE CV LAB;  Service: Cardiovascular;  Laterality: N/A;   LEFT HEART CATH AND CORONARY ANGIOGRAPHY N/A 03/06/2021   Procedure: LEFT HEART CATH AND CORONARY ANGIOGRAPHY;  Surgeon: Tonny Bollman, MD;  Location: [facility] INVASIVE CV LAB;  Service: Cardiovascular;  Laterality: N/A;    Allergies  No Known Allergies  History of Present Illness    Mr. Summons has a PMH of generalized anxiety disorder, GERD, hyperlipidemia, and coronary artery disease.  He presented to the emergency department with complaints of chest pressure while driving.  His troponins level rose from 20s to 200s.  No significant EKG changes were noted.  He was started on aspirin and heparin.  [facility] was contacted for NSTEMI management.  His chest x-ray showed some atelectasis.  He reported that he underwent to left heart catheterizations in the past due to atypical chest pain.  He reported they were normal.  He reported that during this time since his chest pressure/pain was more significant.  He underwent cardiac catheterization on 03/06/2021 which showed 7895% stenosis in his mid and distal circumflex.  He underwent PCI with DES placement x2.  He  was placed on dual antiplatelet therapy aspirin and Brilinta.  He presents the clinic today for follow-up evaluation states***  *** denies chest pain, shortness of breath, lower extremity edema, fatigue, palpitations, melena, hematuria, hemoptysis, diaphoresis, weakness, presyncope, syncope, orthopnea, and PND.   Home Medications    Prior to Admission medications   Medication Sig Start Date End Date Taking? Authorizing Provider  aspirin 81 MG EC tablet Take 1 tablet (81 mg total) by mouth daily. Swallow whole. 03/08/21   Bhagat, Sharrell Ku, PA  atorvastatin (LIPITOR) 80 MG tablet Take 1 tablet (80 mg total) by mouth daily. 03/08/21   Bhagat, Sharrell Ku, PA  busPIRone (BUSPAR) 10 MG tablet Take 10 mg by mouth 2 (two) times daily. 11/18/20   [provider]  clonazePAM (KLONOPIN) 1 MG tablet Take 1 mg by mouth 2 (two) times daily as needed for anxiety.    [provider]  metoprolol tartrate (LOPRESSOR) 25 MG tablet Take 0.5 tablets (12.5 mg total) by mouth 2 (two) times daily. 03/07/21   Bhagat, Sharrell Ku, PA  nitroGLYCERIN (NITROSTAT) 0.4 MG SL tablet Place 1 tablet (0.4 mg total) under the tongue every 5 (five) minutes x 3 doses as needed for chest pain. 03/07/21   Bhagat, Sharrell Ku, PA  omeprazole (PRILOSEC) 40 MG capsule Take 40 mg by mouth daily.    [provider]  PARoxetine (PAXIL) 40 MG tablet Take 40 mg by mouth daily. 02/01/18   [provider]  ticagrelor (BRILINTA) 90 MG TABS tablet Take 1 tablet (90 mg total)  by mouth 2 (two) times daily. 03/07/21   Manson Passey, PA    Family History    No family history on file. He indicated that his mother is deceased. He indicated that his father is deceased.  Social History    Social History   Socioeconomic History   Marital status: Single    Spouse name: Not on file   Number of children: 0   Years of education: 13   Highest education level: Not on file  Occupational History    Employer:  [employer]    Comment: [employer]  Tobacco Use   Smoking status: Never   Smokeless tobacco: Never  Vaping Use   Vaping Use: Never used  Substance and Sexual Activity   Alcohol use: No    Comment: Caffeine user 2-3 sodas a day   Drug use: No   Sexual activity: Not on file  Other Topics Concern   Not on file  Social History Narrative   Not on file   Social Determinants of Health   Financial Resource Strain: Not on file  Food Insecurity: Not on file  Transportation Needs: Not on file  Physical Activity: Not on file  Stress: Not on file  Social Connections: Not on file  Intimate Partner Violence: Not on file     Review of Systems    General:  No chills, fever, night sweats or weight changes.  Cardiovascular:  No chest pain, dyspnea on exertion, edema, orthopnea, palpitations, paroxysmal nocturnal dyspnea. Dermatological: No rash, lesions/masses Respiratory: No cough, dyspnea Urologic: No hematuria, dysuria Abdominal:   No nausea, vomiting, diarrhea, bright red blood per rectum, melena, or hematemesis Neurologic:  No visual changes, wkns, changes in mental status. All other systems reviewed and are otherwise negative except as noted above.  Physical Exam    VS:  There were no vitals taken for this visit. , BMI There is no height or weight on file to calculate BMI. GEN: Well nourished, well developed, in no acute distress. HEENT: normal. Neck: Supple, no JVD, carotid bruits, or masses. Cardiac: RRR, no murmurs, rubs, or gallops. No clubbing, cyanosis, edema.  Radials/DP/PT 2+ and equal bilaterally.  Respiratory:  Respirations regular and unlabored, clear to auscultation bilaterally. GI: Soft, nontender, nondistended, BS + x 4. MS: no deformity or atrophy. Skin: warm and dry, no rash. Neuro:  Strength and sensation are intact. Psych: Normal affect.  Accessory Clinical Findings    Recent Labs: 03/05/2021: ALT 22; B Natriuretic Peptide 9.4 03/07/2021: BUN 9;  Creatinine, Ser 1.04; Hemoglobin 13.7; Platelets 227; Potassium 3.6; Sodium 136   Recent Lipid Panel    Component Value Date/Time   CHOL 174 03/06/2021 0047   TRIG 131 03/06/2021 0047   HDL 33 (L) 03/06/2021 0047   CHOLHDL 5.3 03/06/2021 0047   VLDL 26 03/06/2021 0047   LDLCALC 115 (H) 03/06/2021 0047    ECG personally reviewed by me today- *** - No acute changes  Echocardiogram 03/06/2021 IMPRESSIONS     1. Left ventricular ejection fraction, by estimation, is 55 to 60%. The  left ventricle has normal function. The left ventricle has no regional  wall motion abnormalities. There is moderate left ventricular hypertrophy.  Left ventricular diastolic  parameters were normal.   2. Right ventricular systolic function is normal. The right ventricular  size is normal. There is normal pulmonary artery systolic pressure. The  estimated right ventricular systolic pressure is 25.1 mmHg.   3. The mitral valve is normal in structure. No  evidence of mitral valve  regurgitation.   4. The aortic valve is tricuspid. Aortic valve regurgitation is not  visualized. No aortic stenosis is present.   5. The inferior vena cava is normal in size with greater than 50%  respiratory variability, suggesting right atrial pressure of 3 mmHg.  Cardiac catheterization 03/06/2021 Prox RCA lesion is 40% stenosed. Mid Cx to Dist Cx lesion is 95% stenosed. Mid Cx lesion is 70% stenosed. A drug-eluting stent was successfully placed using a SYNERGY XD 2.50X20. Post intervention, there is a 0% residual stenosis. A drug-eluting stent was successfully placed using a SYNERGY XD 3.0X12. Post intervention, there is a 0% residual stenosis. Mid RCA lesion is 30% stenosed.   1. 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 Successful PCI using a 3.0x12 mm Synergy DES in the mid circumflex, reducing 70% stenosis  to 0%, and a 2.5x20 mm Synergy DES in the distal circumflex, reducing 95% stenosis to 0%. 3. Nonobstructive RCA and LAD plaquing without significant flow-obstructive stenoses 4. Normal LVEDP   Recommend: DAPT with ASA and ticagrelor x 12 months (ACS Class 1 recommendation), aggressive risk reduction  Diagnostic Dominance: Right    Intervention      Assessment & Plan   1.  Coronary artery disease/NSTEMI-no chest pain today.  Underwent cardiac catheterization 03/06/2021 with PCI and DES x2 to his mid and distal circumflex. Continue aspirin, atorvastatin, metoprolol, Brilinta, nitroglycerin Heart healthy low-sodium diet-salty 6 given Increase physical activity as tolerated  Hyperlipidemia-03/06/2021: Cholesterol 174; HDL 33; LDL Cholesterol 115; Triglycerides 131; VLDL 26 Continue atorvastatin, aspirin, Heart healthy low-sodium diet-salty 6 given Increase physical activity as tolerated Repeat fasting lipids and LFTs in 4 weeks.   Disposition: Follow-up with Dr. Allyson Sabal in 3 months.  Thomasene Ripple. Ocean Schildt NP-C    04/23/2021, 12:27 PM [facility] [address] Office [phone] Fax [phone]  Notice: This dictation was prepared with Dragon dictation along with smaller phrase technology. Any transcriptional errors that result from this process are unintentional and may not be corrected upon review.  I spent***minutes examining this patient, reviewing medications, and using patient centered shared decision making involving her cardiac care.  Prior to her visit I spent greater than 20 minutes reviewing her past medical history,  medications, and prior cardiac tests.

## 2021-04-24 ENCOUNTER — Ambulatory Visit: Payer: Self-pay | Admitting: General Practice

## 2021-05-26 ENCOUNTER — Ambulatory Visit (INDEPENDENT_AMBULATORY_CARE_PROVIDER_SITE_OTHER): Payer: Self-pay | Admitting: Cardiovascular Disease

## 2021-05-26 ENCOUNTER — Other Ambulatory Visit: Payer: Self-pay

## 2021-05-26 ENCOUNTER — Encounter: Payer: Self-pay | Admitting: Cardiovascular Disease

## 2021-05-26 DIAGNOSIS — I214 Non-ST elevation (NSTEMI) myocardial infarction: Secondary | ICD-10-CM

## 2021-05-26 DIAGNOSIS — E785 Hyperlipidemia, unspecified: Secondary | ICD-10-CM | POA: Insufficient documentation

## 2021-05-26 DIAGNOSIS — G4733 Obstructive sleep apnea (adult) (pediatric): Secondary | ICD-10-CM

## 2021-05-26 DIAGNOSIS — E782 Mixed hyperlipidemia: Secondary | ICD-10-CM

## 2021-05-26 MED ORDER — EZETIMIBE 10 MG PO TABS: 10.0000 mg | ORAL_TABLET | Freq: Every day | ORAL | 3 refills | Status: AC

## 2021-05-26 NOTE — Assessment & Plan Note (Signed)
History of hyperlipidemia on high-dose statin therapy with lipid profile performed 03/06/2021 revealing a total cholesterol 174, LDL of 115 and HDL 33, not at goal for secondary prevention.  I will start him on Zetia and attempt to get him down to LDL less than 70 but I suspect this will not be effective.  If not, I will refer him to Dr. Rennis Golden to consider initiating PCSK9.

## 2021-05-26 NOTE — Assessment & Plan Note (Signed)
History of obstructive sleep apnea on CPAP which she says does not work for him.  I referred him to Dr. Tresa Endo for optimization of his CPAP settings.

## 2021-05-26 NOTE — Patient Instructions (Addendum)
Medication Instructions:  START zetia 10mg  daily  CONTINUE other current medications  *If you need a refill on your cardiac medications before your next appointment, please call your pharmacy*  Lab Work: FASTING lab work in 3 months to check liver enzymes/lipid panel  Follow-Up: At , you and your health needs are our priority.  As part of our continuing mission to provide you with exceptional heart care, we have created designated Provider Care Teams.  These Care Teams include your primary Cardiologist (physician) and Advanced Practice Providers (APPs -  Physician Assistants and Nurse Practitioners) who all work together to provide you with the care you need, when you need it.  We recommend signing up for the patient portal called "MyChart".  Sign up information is provided on this After Visit Summary.  MyChart is used to connect with patients for Virtual Visits (Telemedicine).  Patients are able to view lab/test results, encounter notes, upcoming appointments, etc.  Non-urgent messages can be sent to your provider as well.   To learn more about what you can do with MyChart, go to [employer].    Your next appointment:   12 month(s)  The format for your next appointment:   In Person  Provider:   You may see [email or web address], MD or one of the following Advanced Practice Providers on your designated Care Team:   [place], PA-C [place], FNP   Dr. Edd Fabian would like you to see Dr. Allyson Sabal for sleep apnea management    Other Instructions

## 2021-05-26 NOTE — Assessment & Plan Note (Signed)
History of non-STEMI 03/06/2021 with cardiac catheterization revealing scattered CAD but high-grade circumflex disease.  He had PCI drug-eluting stenting via the right radial approach by Dr. Excell Seltzer and implanting 2 drug-eluting stents.  LV function was normal.  He remains on aspirin and ticagrelor.  He does complain of some shortness of breath.  We will keep him on Brilinta for 1 year and then discontinue it.

## 2021-05-26 NOTE — Progress Notes (Signed)
05/26/2021 Aaron Rosario   August 20, 1966  [id number]  Primary Physician Chauncey Mann, MD Primary Cardiologist: Runell Gess MD Nicholes Calamity, [place]  HPI:  Aaron Rosario is a 55 y.o. moderately overweight divorced Caucasian male with no children who works as a Audiological scientist.  His primary care provider is through the [facility].  He does have a history of GERD and obstructive sleep apnea on CPAP as well as anxiety.  He has a strong family history of heart disease with father and grandfather both of whom died at an early age of myocardial infarction.  He has a brother who had stents and another brother had CABG.  He presented on 03/06/2021 with chest pain and had a non-STEMI.  Cardiac catheterization performed radially by Dr. Excell Seltzer revealed high-grade AV groove circumflex disease.  He underwent PCI drug-eluting stenting x2.  He had normal LV function.  He remains on DAPT with aspirin and Brilinta.  Does complain of some shortness of breath.   Current Meds  Medication Sig   aspirin 81 MG EC tablet Take 1 tablet (81 mg total) by mouth daily. Swallow whole.   atorvastatin (LIPITOR) 80 MG tablet Take 1 tablet (80 mg total) by mouth daily.   busPIRone (BUSPAR) 10 MG tablet Take 10 mg by mouth 2 (two) times daily.   clonazePAM (KLONOPIN) 1 MG tablet Take 1 mg by mouth 2 (two) times daily as needed for anxiety.   metoprolol tartrate (LOPRESSOR) 25 MG tablet Take 0.5 tablets (12.5 mg total) by mouth 2 (two) times daily.   nitroGLYCERIN (NITROSTAT) 0.4 MG SL tablet Place 1 tablet (0.4 mg total) under the tongue every 5 (five) minutes x 3 doses as needed for chest pain.   omeprazole (PRILOSEC) 40 MG capsule Take 40 mg by mouth daily.   PARoxetine (PAXIL) 40 MG tablet Take 40 mg by mouth daily.   ticagrelor (BRILINTA) 90 MG TABS tablet Take 1 tablet (90 mg total) by mouth 2 (two) times daily.     No Known Allergies  Social History    Socioeconomic History   Marital status: Single    Spouse name: Not on file   Number of children: 0   Years of education: 13   Highest education level: Not on file  Occupational History    Employer: [employer]    Comment: [employer]  Tobacco Use   Smoking status: Never   Smokeless tobacco: Never  Vaping Use   Vaping Use: Never used  Substance and Sexual Activity   Alcohol use: No    Comment: Caffeine user 2-3 sodas a day   Drug use: No   Sexual activity: Not on file  Other Topics Concern   Not on file  Social History Narrative   Not on file   Social Determinants of Health   Financial Resource Strain: Not on file  Food Insecurity: Not on file  Transportation Needs: Not on file  Physical Activity: Not on file  Stress: Not on file  Social Connections: Not on file  Intimate Partner Violence: Not on file     Review of Systems: General: negative for chills, fever, night sweats or weight changes.  Cardiovascular: negative for chest pain, dyspnea on exertion, edema, orthopnea, palpitations, paroxysmal nocturnal dyspnea or shortness of breath Dermatological: negative for rash Respiratory: negative for cough or wheezing Urologic: negative for hematuria Abdominal: negative for nausea, vomiting, diarrhea, bright red blood per rectum, melena,  or hematemesis Neurologic: negative for visual changes, syncope, or dizziness All other systems reviewed and are otherwise negative except as noted above.    Blood pressure 110/69, pulse (!) 57, height 5\' 9"  (1.753 m), weight 250 lb 12.8 oz (113.8 kg), SpO2 97 %.  General appearance: alert and no distress Neck: no adenopathy, no carotid bruit, no JVD, supple, symmetrical, trachea midline, and thyroid not enlarged, symmetric, no tenderness/mass/nodules Lungs: clear to auscultation bilaterally Heart: regular rate and rhythm, S1, S2 normal, no murmur, click, rub or gallop Extremities: extremities normal, atraumatic, no cyanosis or  edema Pulses: 2+ and symmetric Skin: Skin color, texture, turgor normal. No rashes or lesions Neurologic: Grossly normal  EKG sinus bradycardia 59 with small inferior Q waves and nonspecific ST and T wave changes.  I personally reviewed this EKG.  ASSESSMENT AND PLAN:   NSTEMI (non-ST elevated myocardial infarction) (HCC) History of non-STEMI 03/06/2021 with cardiac catheterization revealing scattered CAD but high-grade circumflex disease.  He had PCI drug-eluting stenting via the right radial approach by Dr. 03/08/2021 and implanting 2 drug-eluting stents.  LV function was normal.  He remains on aspirin and ticagrelor.  He does complain of some shortness of breath.  We will keep him on Brilinta for 1 year and then discontinue it.  Hyperlipidemia History of hyperlipidemia on high-dose statin therapy with lipid profile performed 03/06/2021 revealing a total cholesterol 174, LDL of 115 and HDL 33, not at goal for secondary prevention.  I will start him on Zetia and attempt to get him down to LDL less than 70 but I suspect this will not be effective.  If not, I will refer him to Dr. 03/08/2021 to consider initiating PCSK9.  Obstructive sleep apnea History of obstructive sleep apnea on CPAP which she says does not work for him.  I referred him to Dr. Rennis Golden for optimization of his CPAP settings.     Tresa Endo MD FACP,FACC,FAHA, [facility] 05/26/2021 8:47 AM

## 2021-11-03 ENCOUNTER — Ambulatory Visit
Admission: EM | Admit: 2021-11-03 | Discharge: 2021-11-03 | Disposition: A | Discharge status: Home / Self Care | Payer: Non-veteran care | Attending: Family Medicine | Admitting: Family Medicine

## 2021-11-03 ENCOUNTER — Other Ambulatory Visit: Payer: Self-pay

## 2021-11-03 DIAGNOSIS — J069 Acute upper respiratory infection, unspecified: Secondary | ICD-10-CM

## 2021-11-03 MED ORDER — PROMETHAZINE-DM 6.25-15 MG/5ML PO SYRP: 5.0000 mL | ORAL_SOLUTION | Freq: Four times a day (QID) | ORAL | 0 refills | Status: DC | PRN

## 2021-11-03 MED ORDER — PREDNISONE 20 MG PO TABS: 40.0000 mg | ORAL_TABLET | Freq: Every day | ORAL | 0 refills | Status: DC

## 2021-11-03 MED ORDER — MOLNUPIRAVIR EUA 200MG CAPSULE: 4.0000 | ORAL_CAPSULE | Freq: Two times a day (BID) | ORAL | 0 refills | Status: AC

## 2021-11-03 NOTE — ED Provider Notes (Signed)
[facility]    CSN: [id number] Arrival date & time: 11/03/21  1319      History   Chief Complaint Chief Complaint  Patient presents with   Nasal Congestion   Facial Pain   HPI Aaron Rosario is a 56 y.o. male.   Presenting today with several day history of facial pain, cough, sore throat, congestion, headache, fatigue.  Denies chest pain, shortness of breath, abdominal pain, nausea vomiting diarrhea, fever, chills.  So far taking TheraFlu with minimal relief.  No past history of pertinent chronic medical problems.  No known sick contacts recently.  Past Medical History:  Diagnosis Date   Anxiety    Hx of cardiac cath    x2, no stents    Hypersomnia, persistent    Panic attacks    Patient Active Problem List   Diagnosis Date Noted   Hyperlipidemia 05/26/2021   Morbid obesity (HCC) 05/26/2021   Obstructive sleep apnea 05/26/2021   NSTEMI (non-ST elevated myocardial infarction) (HCC) 03/05/2021   Hypersomnia, persistent    Past Surgical History:  Procedure Laterality Date   CHOLECYSTECTOMY     CORONARY STENT INTERVENTION N/A 03/06/2021   Procedure: CORONARY STENT INTERVENTION;  Surgeon: Tonny Bollman, MD;  Location: [facility] INVASIVE CV LAB;  Service: Cardiovascular;  Laterality: N/A;   LEFT HEART CATH AND CORONARY ANGIOGRAPHY N/A 03/06/2021   Procedure: LEFT HEART CATH AND CORONARY ANGIOGRAPHY;  Surgeon: Tonny Bollman, MD;  Location: [facility] INVASIVE CV LAB;  Service: Cardiovascular;  Laterality: N/A;     Home Medications    Prior to Admission medications   Medication Sig Start Date End Date Taking? Authorizing Provider  molnupiravir EUA (LAGEVRIO) 200 mg CAPS capsule Take 4 capsules (800 mg total) by mouth 2 (two) times daily for 5 days. 11/03/21 11/08/21 Yes Particia Nearing, PA-C  predniSONE (DELTASONE) 20 MG tablet Take 2 tablets (40 mg total) by mouth daily with breakfast. 11/03/21  Yes Particia Nearing, PA-C  promethazine-dextromethorphan  (PROMETHAZINE-DM) 6.25-15 MG/5ML syrup Take 5 mLs by mouth 4 (four) times daily as needed. 11/03/21  Yes Particia Nearing, PA-C  aspirin 81 MG EC tablet Take 1 tablet (81 mg total) by mouth daily. Swallow whole. 03/08/21   Bhagat, Sharrell Ku, PA  atorvastatin (LIPITOR) 80 MG tablet Take 1 tablet (80 mg total) by mouth daily. 03/08/21   Bhagat, Sharrell Ku, PA  busPIRone (BUSPAR) 10 MG tablet Take 10 mg by mouth 2 (two) times daily. 11/18/20   [provider]  clonazePAM (KLONOPIN) 1 MG tablet Take 1 mg by mouth 2 (two) times daily as needed for anxiety.    [provider]  ezetimibe (ZETIA) 10 MG tablet Take 1 tablet (10 mg total) by mouth daily. 05/26/21 08/24/21  Runell Gess, MD  metoprolol tartrate (LOPRESSOR) 25 MG tablet Take 0.5 tablets (12.5 mg total) by mouth 2 (two) times daily. 03/07/21   Bhagat, Sharrell Ku, PA  nitroGLYCERIN (NITROSTAT) 0.4 MG SL tablet Place 1 tablet (0.4 mg total) under the tongue every 5 (five) minutes x 3 doses as needed for chest pain. 03/07/21   Bhagat, Sharrell Ku, PA  omeprazole (PRILOSEC) 40 MG capsule Take 40 mg by mouth daily.    [provider]  PARoxetine (PAXIL) 40 MG tablet Take 40 mg by mouth daily. 02/01/18   [provider]  ticagrelor (BRILINTA) 90 MG TABS tablet Take 1 tablet (90 mg total) by mouth 2 (two) times daily. 03/07/21   Manson Passey, PA    Family History History  reviewed. No pertinent family history.  Social History Social History   Tobacco Use   Smoking status: Never   Smokeless tobacco: Never  Vaping Use   Vaping Use: Never used  Substance Use Topics   Alcohol use: No    Comment: Caffeine user 2-3 sodas a day   Drug use: No     Allergies   Patient has no known allergies.   Review of Systems Review of Systems Per HPI  Physical Exam Triage Vital Signs ED Triage Vitals  Enc Vitals Group     BP 11/03/21 1609 118/76     Pulse Rate 11/03/21 1609 69     Resp 11/03/21 1609 18      Temp 11/03/21 1609 99.5 F (37.5 C)     Temp src --      SpO2 11/03/21 1609 96 %     Weight --      Height --      Head Circumference --      Peak Flow --      Pain Score 11/03/21 1607 4     Pain Loc --      Pain Edu? --      Excl. in GC? --    No data found.  Updated Vital Signs BP 118/76    Pulse 69    Temp 99.5 F (37.5 C)    Resp 18    SpO2 96%   Visual Acuity Right Eye Distance:   Left Eye Distance:   Bilateral Distance:    Right Eye Near:   Left Eye Near:    Bilateral Near:     Physical Exam Vitals and nursing note reviewed.  Constitutional:      Appearance: He is well-developed.  HENT:     Head: Atraumatic.     Right Ear: External ear normal.     Left Ear: External ear normal.     Nose: Rhinorrhea present.     Mouth/Throat:     Pharynx: Posterior oropharyngeal erythema present. No oropharyngeal exudate.  Eyes:     Conjunctiva/sclera: Conjunctivae normal.     Pupils: Pupils are equal, round, and reactive to light.  Cardiovascular:     Rate and Rhythm: Normal rate and regular rhythm.  Pulmonary:     Effort: Pulmonary effort is normal. No respiratory distress.     Breath sounds: No wheezing or rales.  Musculoskeletal:        General: Normal range of motion.     Cervical back: Normal range of motion and neck supple.  Lymphadenopathy:     Cervical: No cervical adenopathy.  Skin:    General: Skin is warm and dry.  Neurological:     Mental Status: He is alert and oriented to person, place, and time.  Psychiatric:        Behavior: Behavior normal.     UC Treatments / Results  Labs (all labs ordered are listed, but only abnormal results are displayed) Labs Reviewed  COVID-19, FLU A+B NAA    EKG   Radiology No results found.  Procedures Procedures (including critical care time)  Medications Ordered in UC Medications - No data to display  Initial Impression / Assessment and Plan / UC Course  I have reviewed the triage vital signs and  the nursing notes.  Pertinent labs & imaging results that were available during my care of the patient were reviewed by me and considered in my medical decision making (see chart for details).     COVID  and flu testing pending, suspect viral upper respiratory infection causing symptoms.  Will treat with prednisone, Phenergan DM and start antiviral for COVID in case COVID-positive.  Discussed supportive over-the-counter medications and home care.  Return for acutely worsening symptoms  Final Clinical Impressions(s) / UC Diagnoses   Final diagnoses:  Viral URI   Discharge Instructions   None    ED Prescriptions     Medication Sig Dispense Auth. Provider   predniSONE (DELTASONE) 20 MG tablet Take 2 tablets (40 mg total) by mouth daily with breakfast. 10 tablet Particia Nearing, PA-C   molnupiravir EUA (LAGEVRIO) 200 mg CAPS capsule Take 4 capsules (800 mg total) by mouth 2 (two) times daily for 5 days. 40 capsule Particia Nearing, [place]   promethazine-dextromethorphan (PROMETHAZINE-DM) 6.25-15 MG/5ML syrup Take 5 mLs by mouth 4 (four) times daily as needed. 100 mL Particia Nearing, [place]      PDMP not reviewed this encounter.   Particia Nearing, [place] 11/03/21 1745

## 2021-11-03 NOTE — ED Triage Notes (Signed)
Pt presents with c/o nasal congestion with facial pain and cough for past few days.

## 2021-11-04 LAB — COVID-19, FLU A+B NAA
Influenza A, NAA: NOT DETECTED
Influenza B, NAA: NOT DETECTED
SARS-CoV-2, NAA: NOT DETECTED

## 2021-11-13 ENCOUNTER — Ambulatory Visit
Admission: EM | Admit: 2021-11-13 | Discharge: 2021-11-13 | Disposition: A | Discharge status: Home / Self Care | Payer: Non-veteran care | Attending: Family Medicine | Admitting: Family Medicine

## 2021-11-13 ENCOUNTER — Other Ambulatory Visit: Payer: Self-pay

## 2021-11-13 DIAGNOSIS — J0100 Acute maxillary sinusitis, unspecified: Secondary | ICD-10-CM | POA: Diagnosis not present

## 2021-11-13 MED ORDER — AMOXICILLIN-POT CLAVULANATE 875-125 MG PO TABS: 1.0000 | ORAL_TABLET | Freq: Two times a day (BID) | ORAL | 0 refills | Status: DC

## 2021-11-13 NOTE — ED Provider Notes (Signed)
[facility]    CSN: [id number] Arrival date & time: 11/13/21  1712      History   Chief Complaint Chief Complaint  Patient presents with   Facial Pain    HPI Aaron Rosario is a 56 y.o. male.   Presenting today with progressively worsening left-sided facial pain, pressure, congestion, sinus headache.  Had a viral upper respiratory infection over a week ago, states other symptoms have resolved but this progressively worsens.  Denies fever, chills, chest pain, shortness of breath, abdominal pain, nausea vomiting or diarrhea.  Taking nasal spray, Mucinex with minimal relief.  COVID and flu testing at onset of symptoms was negative.   Past Medical History:  Diagnosis Date   Anxiety    Hx of cardiac cath    x2, no stents    Hypersomnia, persistent    Panic attacks     Patient Active Problem List   Diagnosis Date Noted   Hyperlipidemia 05/26/2021   Morbid obesity (HCC) 05/26/2021   Obstructive sleep apnea 05/26/2021   NSTEMI (non-ST elevated myocardial infarction) (HCC) 03/05/2021   Hypersomnia, persistent     Past Surgical History:  Procedure Laterality Date   CHOLECYSTECTOMY     CORONARY STENT INTERVENTION N/A 03/06/2021   Procedure: CORONARY STENT INTERVENTION;  Surgeon: Tonny Bollman, MD;  Location: [facility] INVASIVE CV LAB;  Service: Cardiovascular;  Laterality: N/A;   LEFT HEART CATH AND CORONARY ANGIOGRAPHY N/A 03/06/2021   Procedure: LEFT HEART CATH AND CORONARY ANGIOGRAPHY;  Surgeon: Tonny Bollman, MD;  Location: [facility] INVASIVE CV LAB;  Service: Cardiovascular;  Laterality: N/A;       Home Medications    Prior to Admission medications   Medication Sig Start Date End Date Taking? Authorizing Provider  amoxicillin-clavulanate (AUGMENTIN) 875-125 MG tablet Take 1 tablet by mouth every 12 (twelve) hours. 11/13/21  Yes Particia Nearing, PA-C  aspirin 81 MG EC tablet Take 1 tablet (81 mg total) by mouth daily. Swallow whole. 03/08/21   Bhagat,  Sharrell Ku, PA  atorvastatin (LIPITOR) 80 MG tablet Take 1 tablet (80 mg total) by mouth daily. 03/08/21   Bhagat, Sharrell Ku, PA  busPIRone (BUSPAR) 10 MG tablet Take 10 mg by mouth 2 (two) times daily. 11/18/20   [provider]  clonazePAM (KLONOPIN) 1 MG tablet Take 1 mg by mouth 2 (two) times daily as needed for anxiety.    [provider]  ezetimibe (ZETIA) 10 MG tablet Take 1 tablet (10 mg total) by mouth daily. 05/26/21 08/24/21  Runell Gess, MD  metoprolol tartrate (LOPRESSOR) 25 MG tablet Take 0.5 tablets (12.5 mg total) by mouth 2 (two) times daily. 03/07/21   Bhagat, Sharrell Ku, PA  nitroGLYCERIN (NITROSTAT) 0.4 MG SL tablet Place 1 tablet (0.4 mg total) under the tongue every 5 (five) minutes x 3 doses as needed for chest pain. 03/07/21   Bhagat, Sharrell Ku, PA  omeprazole (PRILOSEC) 40 MG capsule Take 40 mg by mouth daily.    [provider]  PARoxetine (PAXIL) 40 MG tablet Take 40 mg by mouth daily. 02/01/18   [provider]  predniSONE (DELTASONE) 20 MG tablet Take 2 tablets (40 mg total) by mouth daily with breakfast. 11/03/21   Particia Nearing, PA-C  promethazine-dextromethorphan (PROMETHAZINE-DM) 6.25-15 MG/5ML syrup Take 5 mLs by mouth 4 (four) times daily as needed. 11/03/21   Particia Nearing, PA-C  ticagrelor (BRILINTA) 90 MG TABS tablet Take 1 tablet (90 mg total) by mouth 2 (two) times daily. 03/07/21   Bhagat,  Sharrell Ku, PA    Family History History reviewed. No pertinent family history.  Social History Social History   Tobacco Use   Smoking status: Never   Smokeless tobacco: Never  Vaping Use   Vaping Use: Never used  Substance Use Topics   Alcohol use: No    Comment: Caffeine user 2-3 sodas a day   Drug use: No     Allergies   Patient has no known allergies.   Review of Systems Review of Systems Per HPI  Physical Exam Triage Vital Signs ED Triage Vitals  Enc Vitals Group     BP 11/13/21 1902  129/83     Pulse Rate 11/13/21 1902 (!) 56     Resp 11/13/21 1902 18     Temp 11/13/21 1902 98.5 F (36.9 C)     Temp src --      SpO2 11/13/21 1902 98 %     Weight --      Height --      Head Circumference --      Peak Flow --      Pain Score 11/13/21 1901 6     Pain Loc --      Pain Edu? --      Excl. in GC? --    No data found.  Updated Vital Signs BP 129/83    Pulse (!) 56    Temp 98.5 F (36.9 C)    Resp 18    SpO2 98%   Visual Acuity Right Eye Distance:   Left Eye Distance:   Bilateral Distance:    Right Eye Near:   Left Eye Near:    Bilateral Near:     Physical Exam Vitals and nursing note reviewed.  Constitutional:      Appearance: He is well-developed.  HENT:     Head: Atraumatic.     Right Ear: External ear normal.     Left Ear: External ear normal.     Nose: Congestion present.     Comments: Left maxillary sinus tender to palpation    Mouth/Throat:     Pharynx: Posterior oropharyngeal erythema present. No oropharyngeal exudate.  Eyes:     Conjunctiva/sclera: Conjunctivae normal.     Pupils: Pupils are equal, round, and reactive to light.  Cardiovascular:     Rate and Rhythm: Normal rate and regular rhythm.  Pulmonary:     Effort: Pulmonary effort is normal. No respiratory distress.     Breath sounds: No wheezing or rales.  Musculoskeletal:        General: Normal range of motion.     Cervical back: Normal range of motion and neck supple.  Lymphadenopathy:     Cervical: No cervical adenopathy.  Skin:    General: Skin is warm and dry.  Neurological:     Mental Status: He is alert and oriented to person, place, and time.  Psychiatric:        Behavior: Behavior normal.     UC Treatments / Results  Labs (all labs ordered are listed, but only abnormal results are displayed) Labs Reviewed - No data to display  EKG   Radiology No results found.  Procedures Procedures (including critical care time)  Medications Ordered in UC Medications  - No data to display  Initial Impression / Assessment and Plan / UC Course  I have reviewed the triage vital signs and the nursing notes.  Pertinent labs & imaging results that were available during my care of the patient were reviewed  by me and considered in my medical decision making (see chart for details).     Vital signs overall reassuring, suspect left maxillary sinusitis.  We will treat with Augmentin, sinus rinses, nasal spray, Mucinex.  Return for acutely worsening symptoms.  Final Clinical Impressions(s) / UC Diagnoses   Final diagnoses:  Acute maxillary sinusitis, recurrence not specified   Discharge Instructions   None    ED Prescriptions     Medication Sig Dispense Auth. Provider   amoxicillin-clavulanate (AUGMENTIN) 875-125 MG tablet Take 1 tablet by mouth every 12 (twelve) hours. 14 tablet Particia Nearing, [place]      PDMP not reviewed this encounter.   Particia Nearing, [place] 11/13/21 1934

## 2021-11-13 NOTE — ED Triage Notes (Signed)
Pt presents with nasal congestion and left side facial pain for almost 2 weeks

## 2022-01-01 ENCOUNTER — Ambulatory Visit (INDEPENDENT_AMBULATORY_CARE_PROVIDER_SITE_OTHER): Payer: No Typology Code available for payment source

## 2022-01-01 ENCOUNTER — Ambulatory Visit
Admission: EM | Admit: 2022-01-01 | Discharge: 2022-01-01 | Disposition: A | Discharge status: Home / Self Care | Payer: No Typology Code available for payment source | Attending: Urgent Care | Admitting: Urgent Care

## 2022-01-01 DIAGNOSIS — R062 Wheezing: Secondary | ICD-10-CM

## 2022-01-01 DIAGNOSIS — R058 Other specified cough: Secondary | ICD-10-CM

## 2022-01-01 MED ORDER — PREDNISONE 20 MG PO TABS: ORAL_TABLET | ORAL | 0 refills | Status: DC

## 2022-01-01 MED ORDER — PROMETHAZINE-DM 6.25-15 MG/5ML PO SYRP: 5.0000 mL | ORAL_SOLUTION | Freq: Three times a day (TID) | ORAL | 0 refills | Status: DC | PRN

## 2022-01-01 MED ORDER — ALBUTEROL SULFATE HFA 108 (90 BASE) MCG/ACT IN AERS: 1.0000 | INHALATION_SPRAY | Freq: Four times a day (QID) | RESPIRATORY_TRACT | 0 refills | Status: DC | PRN

## 2022-01-01 NOTE — ED Triage Notes (Signed)
Pt presents with cough and chest congestion with wheezing that began on Friday  ?

## 2022-01-01 NOTE — ED Provider Notes (Signed)
?[facility] ? ? ?MRN: [id number] DOB: [birth year] ? ?Subjective:  ? ?Aaron Rosario is a 56 y.o. male with pmh of heart disease OSA presenting for 3-4 day history of acute onset productive cough that progressed to chest congestion, wheezing. No smoking, history of respiratory disorders. Had a COVID test at home, was negative.  Has previously been seen by pulmonologist years ago. ? ?No current facility-administered medications for this encounter. ? ?Current Outpatient Medications:  ?  amoxicillin-clavulanate (AUGMENTIN) 875-125 MG tablet, Take 1 tablet by mouth every 12 (twelve) hours., Disp: 14 tablet, Rfl: 0 ?  aspirin 81 MG EC tablet, Take 1 tablet (81 mg total) by mouth daily. Swallow whole., Disp: 30 tablet, Rfl: 11 ?  atorvastatin (LIPITOR) 80 MG tablet, Take 1 tablet (80 mg total) by mouth daily., Disp: 30 tablet, Rfl: 11 ?  busPIRone (BUSPAR) 10 MG tablet, Take 10 mg by mouth 2 (two) times daily., Disp: , Rfl:  ?  clonazePAM (KLONOPIN) 1 MG tablet, Take 1 mg by mouth 2 (two) times daily as needed for anxiety., Disp: , Rfl:  ?  ezetimibe (ZETIA) 10 MG tablet, Take 1 tablet (10 mg total) by mouth daily., Disp: 90 tablet, Rfl: 3 ?  metoprolol tartrate (LOPRESSOR) 25 MG tablet, Take 0.5 tablets (12.5 mg total) by mouth 2 (two) times daily., Disp: 30 tablet, Rfl: 11 ?  nitroGLYCERIN (NITROSTAT) 0.4 MG SL tablet, Place 1 tablet (0.4 mg total) under the tongue every 5 (five) minutes x 3 doses as needed for chest pain., Disp: 100 tablet, Rfl: 1 ?  omeprazole (PRILOSEC) 40 MG capsule, Take 40 mg by mouth daily., Disp: , Rfl:  ?  PARoxetine (PAXIL) 40 MG tablet, Take 40 mg by mouth daily., Disp: , Rfl: 5 ?  predniSONE (DELTASONE) 20 MG tablet, Take 2 tablets (40 mg total) by mouth daily with breakfast., Disp: 10 tablet, Rfl: 0 ?  promethazine-dextromethorphan (PROMETHAZINE-DM) 6.25-15 MG/5ML syrup, Take 5 mLs by mouth 4 (four) times daily as needed., Disp: 100 mL, Rfl: 0 ?  ticagrelor (BRILINTA)  90 MG TABS tablet, Take 1 tablet (90 mg total) by mouth 2 (two) times daily., Disp: 60 tablet, Rfl: 11  ? ?Allergies  ?Allergen Reactions  ? Augmentin [Amoxicillin-Pot Clavulanate] Hives  ? ? ?Past Medical History:  ?Diagnosis Date  ? Anxiety   ? Hx of cardiac cath   ? x2, no stents   ? Hypersomnia, persistent   ? Panic attacks   ?  ? ?Past Surgical History:  ?Procedure Laterality Date  ? CHOLECYSTECTOMY    ? CORONARY STENT INTERVENTION N/A 03/06/2021  ? Procedure: CORONARY STENT INTERVENTION;  Surgeon: Tonny Bollman, MD;  Location: [facility] INVASIVE CV LAB;  Service: Cardiovascular;  Laterality: N/A;  ? LEFT HEART CATH AND CORONARY ANGIOGRAPHY N/A 03/06/2021  ? Procedure: LEFT HEART CATH AND CORONARY ANGIOGRAPHY;  Surgeon: Tonny Bollman, MD;  Location: [facility] INVASIVE CV LAB;  Service: Cardiovascular;  Laterality: N/A;  ? ? ?History reviewed. No pertinent family history. ? ?Social History  ? ?Tobacco Use  ? Smoking status: Never  ? Smokeless tobacco: Never  ?Vaping Use  ? Vaping Use: Never used  ?Substance Use Topics  ? Alcohol use: No  ?  Comment: Caffeine user 2-3 sodas a day  ? Drug use: No  ? ? ?ROS ? ? ?Objective:  ? ?Vitals: ?BP (!) 142/81   Pulse 72   Temp 97.9 ?F (36.6 ?C)   Resp 18   SpO2 96%  ? ?Physical Exam ?Constitutional:   ?  General: He is not in acute distress. ?   Appearance: Normal appearance. He is well-developed. He is not ill-appearing, toxic-appearing or diaphoretic.  ?HENT:  ?   Head: Normocephalic and atraumatic.  ?   Right Ear: External ear normal.  ?   Left Ear: External ear normal.  ?   Nose: Nose normal.  ?   Mouth/Throat:  ?   Mouth: Mucous membranes are moist.  ?Eyes:  ?   General: No scleral icterus.    ?   Right eye: No discharge.     ?   Left eye: No discharge.  ?   Extraocular Movements: Extraocular movements intact.  ?Cardiovascular:  ?   Rate and Rhythm: Normal rate and regular rhythm.  ?   Heart sounds: Normal heart sounds. No murmur heard. ?  No friction rub. No gallop.   ?Pulmonary:  ?   Effort: Pulmonary effort is normal. No accessory muscle usage, respiratory distress or retractions.  ?   Breath sounds: No stridor. Examination of the right-upper field reveals wheezing. Examination of the left-upper field reveals wheezing. Examination of the right-middle field reveals wheezing. Examination of the left-middle field reveals wheezing. Examination of the right-lower field reveals wheezing. Examination of the left-lower field reveals wheezing. Wheezing present. No decreased breath sounds, rhonchi or rales.  ?Neurological:  ?   Mental Status: He is alert and oriented to person, place, and time.  ?Psychiatric:     ?   Mood and Affect: Mood normal.     ?   Behavior: Behavior normal.     ?   Thought Content: Thought content normal.  ? ?DG Chest 2 View ? ?Result Date: 01/01/2022 ?CLINICAL DATA:  Wheezing EXAM: CHEST - 2 VIEW COMPARISON:  Chest x-ray 03/04/2021 FINDINGS: Heart size and mediastinal contours are within normal limits. No suspicious pulmonary opacities identified. No pleural effusion or pneumothorax visualized. No acute osseous abnormality appreciated. IMPRESSION: No acute intrathoracic process identified. Electronically Signed   By: Jannifer Hick M.D.   On: 01/01/2022 10:34   ? ?Assessment and Plan :  ? ?PDMP not reviewed this encounter. ? ?1. Wheezing   ?2. Productive cough   ? ?We will manage his respiratory symptoms aggressively with a steroid course given his lung sounds and pain with his coughing.  Will defer antibiotic use given negative chest x-ray.  Counseled on possibility of reactive airway disease, allergic asthma versus viral respiratory illness.  [facility] Counseled patient on potential for adverse effects with medications prescribed/recommended today, ER and return-to-clinic precautions discussed, patient verbalized understanding. ? ?  ?Wallis Bamberg, PA-C ?01/01/22 1052 ? ?

## 2022-06-13 IMAGING — DX DG CHEST 2V
2 series · 2 of 2 positions shown · non-contrast
Comparison: Chest x-ray 03/04/2021

CLINICAL DATA: Wheezing

EXAM:
CHEST - 2 VIEW

[chest pa]
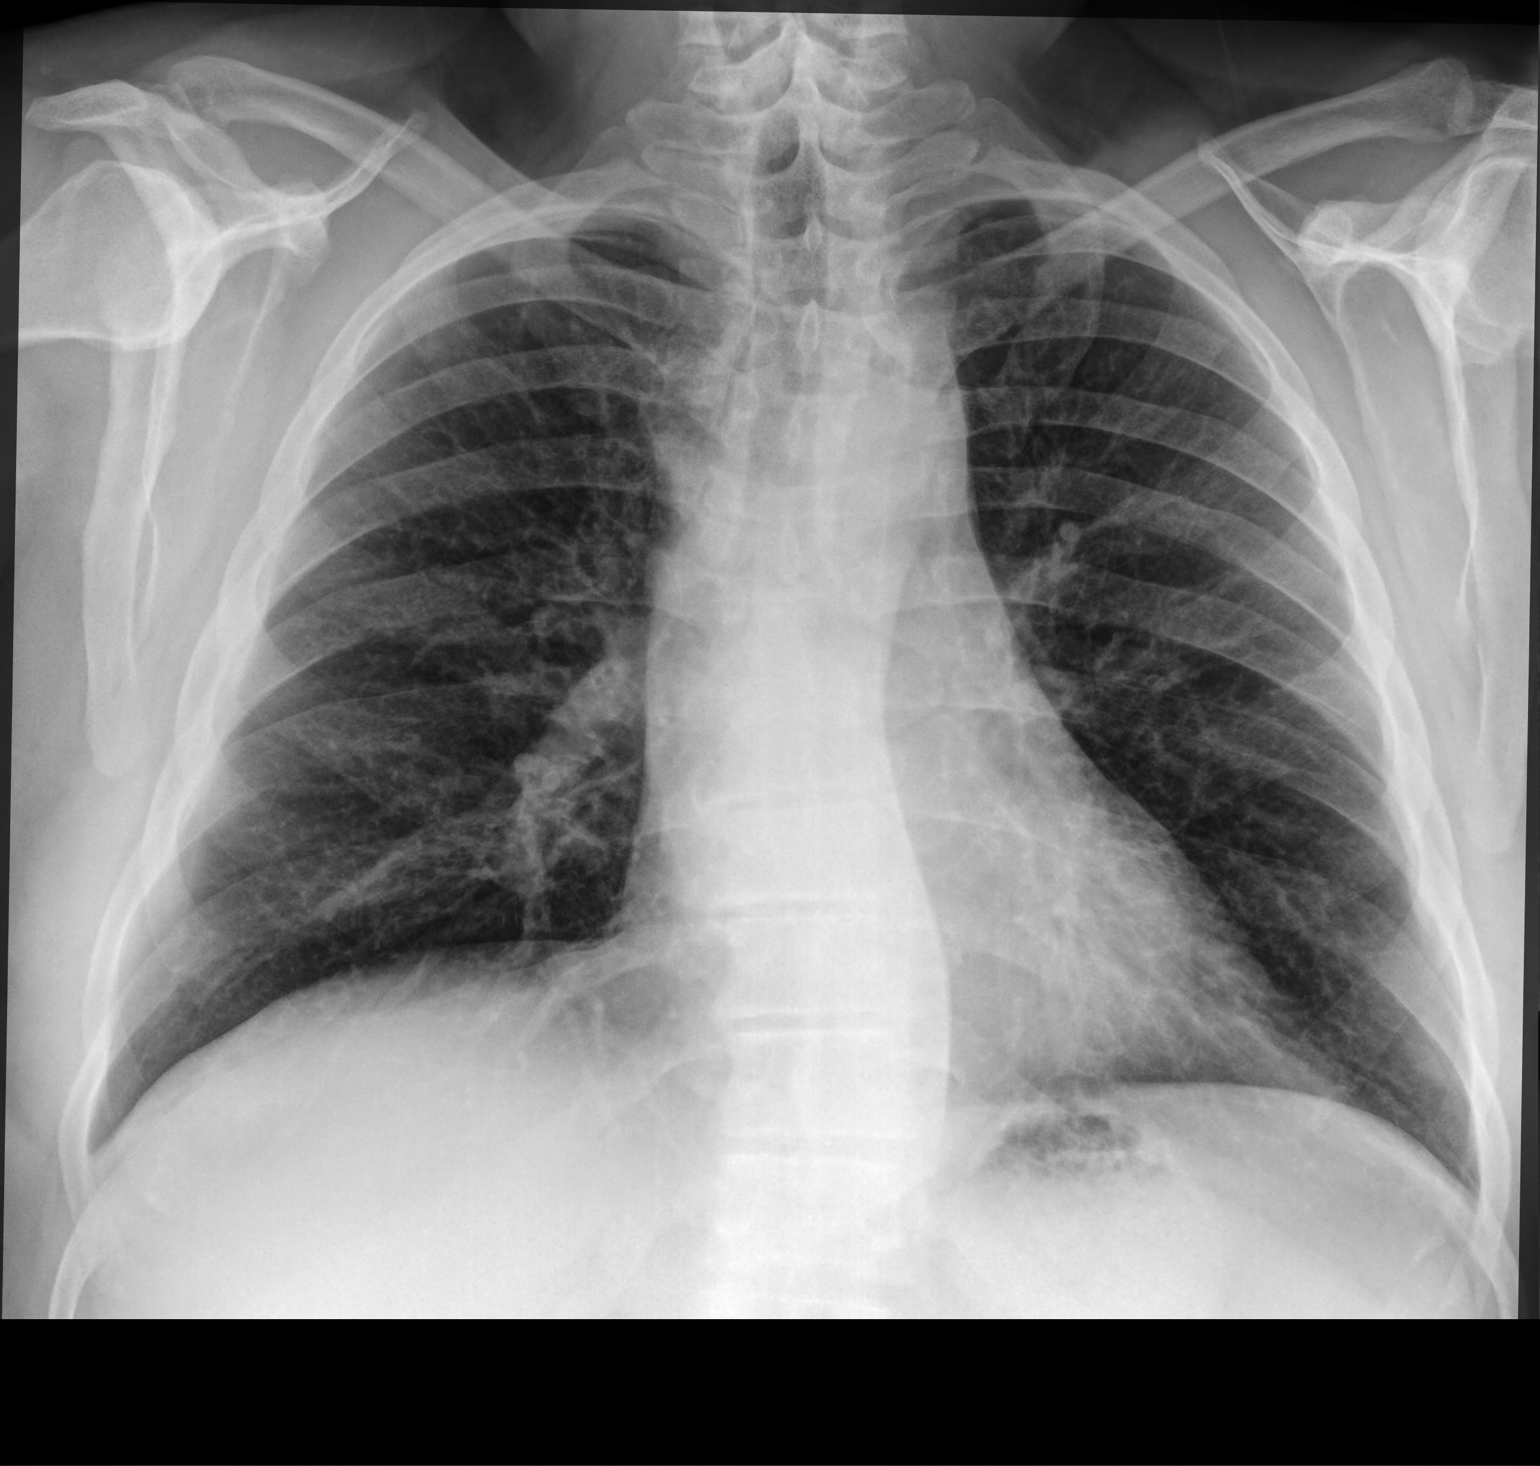

[chest lat]
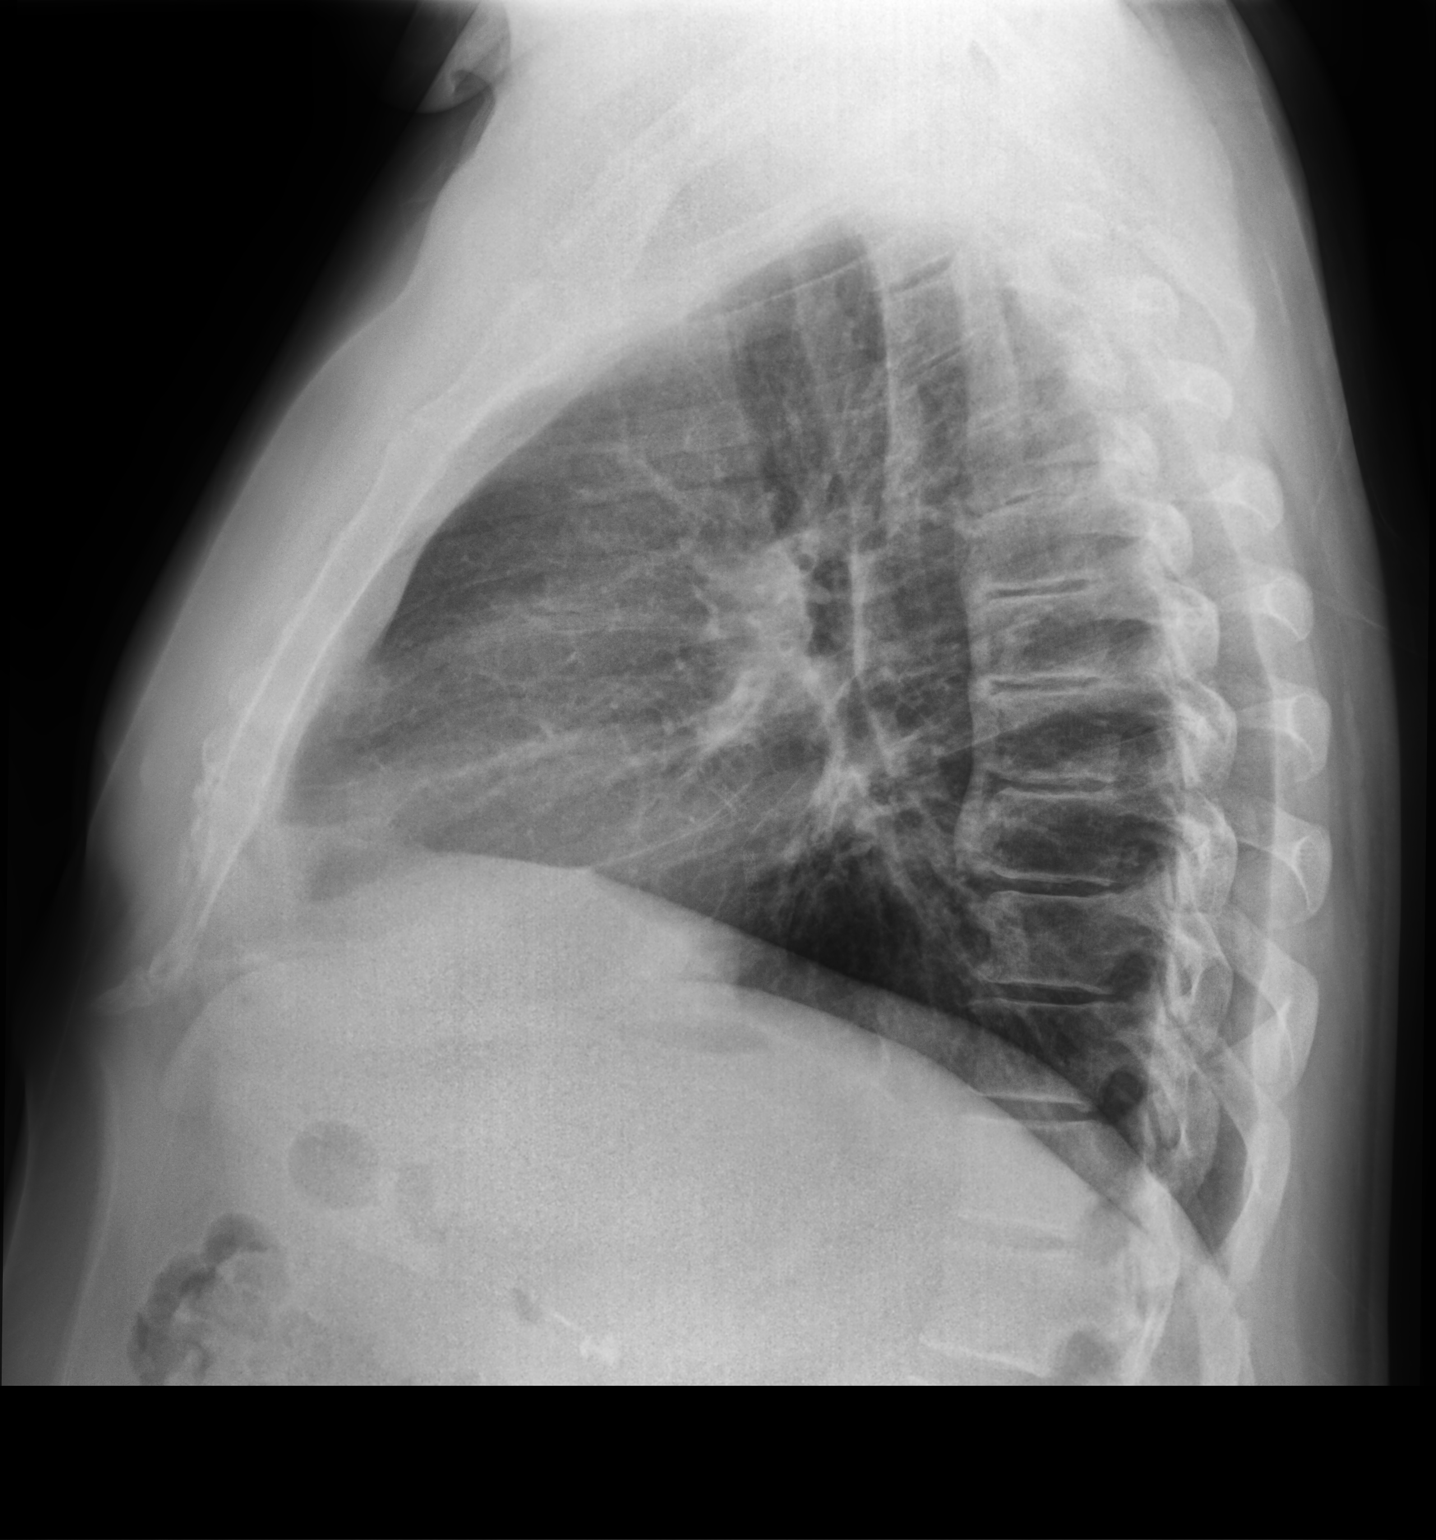

[2 of 2 positions shown; findings below may reference images not displayed]

FINDINGS: Heart size and mediastinal contours are within normal limits. No
suspicious pulmonary opacities identified.

No pleural effusion or pneumothorax visualized.

No acute osseous abnormality appreciated.
IMPRESSION: No acute intrathoracic process identified.

## 2022-07-20 ENCOUNTER — Encounter (HOSPITAL_COMMUNITY): Payer: Self-pay | Admitting: Emergency Medicine

## 2022-07-20 ENCOUNTER — Emergency Department (HOSPITAL_COMMUNITY): Payer: No Typology Code available for payment source

## 2022-07-20 ENCOUNTER — Other Ambulatory Visit: Payer: Self-pay

## 2022-07-20 ENCOUNTER — Emergency Department (HOSPITAL_COMMUNITY)
Admission: EM | Admit: 2022-07-20 | Discharge: 2022-07-20 | Disposition: A | Discharge status: Home / Self Care | Payer: No Typology Code available for payment source | Attending: Emergency Medicine | Admitting: Emergency Medicine

## 2022-07-20 DIAGNOSIS — I2510 Atherosclerotic heart disease of native coronary artery without angina pectoris: Secondary | ICD-10-CM | POA: Insufficient documentation

## 2022-07-20 DIAGNOSIS — I10 Essential (primary) hypertension: Secondary | ICD-10-CM | POA: Diagnosis not present

## 2022-07-20 DIAGNOSIS — Z79899 Other long term (current) drug therapy: Secondary | ICD-10-CM | POA: Insufficient documentation

## 2022-07-20 DIAGNOSIS — Z7982 Long term (current) use of aspirin: Secondary | ICD-10-CM | POA: Diagnosis not present

## 2022-07-20 DIAGNOSIS — R109 Unspecified abdominal pain: Secondary | ICD-10-CM

## 2022-07-20 DIAGNOSIS — R1032 Left lower quadrant pain: Secondary | ICD-10-CM | POA: Diagnosis present

## 2022-07-20 LAB — CBC WITH DIFFERENTIAL/PLATELET
Abs Immature Granulocytes: 0.05 10*3/uL (ref 0.00–0.07)
Basophils Absolute: 0.1 10*3/uL (ref 0.0–0.1)
Basophils Relative: 1 %
Eosinophils Absolute: 0.1 10*3/uL (ref 0.0–0.5)
Eosinophils Relative: 3 %
HCT: 41.9 % (ref 39.0–52.0)
Hemoglobin: 14 g/dL (ref 13.0–17.0)
Immature Granulocytes: 1 %
Lymphocytes Relative: 18 %
Lymphs Abs: 0.9 10*3/uL (ref 0.7–4.0)
MCH: 28.9 pg (ref 26.0–34.0)
MCHC: 33.4 g/dL (ref 30.0–36.0)
MCV: 86.6 fL (ref 80.0–100.0)
Monocytes Absolute: 0.3 10*3/uL (ref 0.1–1.0)
Monocytes Relative: 7 %
Neutro Abs: 3.4 10*3/uL (ref 1.7–7.7)
Neutrophils Relative %: 70 %
Platelets: 205 10*3/uL (ref 150–400)
RBC: 4.84 MIL/uL (ref 4.22–5.81)
RDW: 13.6 % (ref 11.5–15.5)
WBC: 4.9 10*3/uL (ref 4.0–10.5)
nRBC: 0 % (ref 0.0–0.2)

## 2022-07-20 LAB — COMPREHENSIVE METABOLIC PANEL
ALT: 21 U/L (ref 0–44)
AST: 18 U/L (ref 15–41)
Albumin: 3.9 g/dL (ref 3.5–5.0)
Alkaline Phosphatase: 76 U/L (ref 38–126)
Anion gap: 6 (ref 5–15)
BUN: 12 mg/dL (ref 6–20)
CO2: 27 mmol/L (ref 22–32)
Calcium: 8.7 mg/dL — ABNORMAL LOW (ref 8.9–10.3)
Chloride: 107 mmol/L (ref 98–111)
Creatinine, Ser: 0.79 mg/dL (ref 0.61–1.24)
GFR, Estimated: >60 mL/min (ref >60)
Glucose, Bld: 112 mg/dL — ABNORMAL HIGH (ref 70–99)
Potassium: 4.3 mmol/L (ref 3.5–5.1)
Sodium: 140 mmol/L (ref 135–145)
Total Bilirubin: 0.7 mg/dL (ref 0.3–1.2)
Total Protein: 6.6 g/dL (ref 6.5–8.1)

## 2022-07-20 LAB — URINALYSIS, ROUTINE W REFLEX MICROSCOPIC
Bilirubin Urine: NEGATIVE
Glucose, UA: NEGATIVE mg/dL
Hgb urine dipstick: NEGATIVE
Ketones, ur: NEGATIVE mg/dL
Leukocytes,Ua: NEGATIVE
Nitrite: NEGATIVE
Protein, ur: NEGATIVE mg/dL
Specific Gravity, Urine: 1.019 (ref 1.005–1.030)
pH: 5 (ref 5.0–8.0)

## 2022-07-20 MED ORDER — PREDNISONE 10 MG PO TABS: 20.0000 mg | ORAL_TABLET | Freq: Every day | ORAL | 0 refills | Status: DC

## 2022-07-20 MED ORDER — HYDROCODONE-ACETAMINOPHEN 5-325 MG PO TABS: ORAL_TABLET | ORAL | 0 refills | Status: DC

## 2022-07-20 NOTE — ED Triage Notes (Signed)
Pt c/o left flank pain x 3 days. Denies gu symptoms. Nad at this time

## 2022-07-20 NOTE — Discharge Instructions (Signed)
Follow-up with Dr. Amedeo Kinsman or one of his partners next week for recheck.  No lifting over 10 pounds until recheck by Dr.

## 2022-07-20 NOTE — ED Provider Notes (Signed)
[facility] EMERGENCY DEPARTMENT Provider Note   CSN: [id number] Arrival date & time: 07/20/22  4854     History {Add pertinent medical, surgical, social history, OB history to HPI:1} Chief Complaint  Patient presents with   Flank Pain    Aaron Rosario is a 56 y.o. male.  Patient with a history of coronary disease.  He complains of left flank pain and lumbar pain radiating to his groin   Flank Pain       Home Medications Prior to Admission medications   Medication Sig Start Date End Date Taking? Authorizing Provider  albuterol (VENTOLIN HFA) 108 (90 Base) MCG/ACT inhaler Inhale 1 puff into the lungs every 6 (six) hours as needed for wheezing or shortness of breath. 01/01/22  Yes Wallis Bamberg, PA-C  aspirin 81 MG EC tablet Take 1 tablet (81 mg total) by mouth daily. Swallow whole. 03/08/21  Yes Bhagat, Bhavinkumar, PA  atorvastatin (LIPITOR) 80 MG tablet Take 1 tablet (80 mg total) by mouth daily. 03/08/21  Yes Bhagat, Bhavinkumar, PA  busPIRone (BUSPAR) 10 MG tablet Take 10 mg by mouth 2 (two) times daily. 11/18/20  Yes [provider]  clonazePAM (KLONOPIN) 1 MG tablet Take 1 mg by mouth 2 (two) times daily as needed for anxiety.   Yes [provider]  HYDROcodone-acetaminophen (NORCO/VICODIN) 5-325 MG tablet Take 1 every 6 hours as needed for pain not relieved by Tylenol alone 07/20/22  Yes Bethann Berkshire, MD  metoprolol tartrate (LOPRESSOR) 25 MG tablet Take 0.5 tablets (12.5 mg total) by mouth 2 (two) times daily. Patient taking differently: Take 12.5 mg by mouth daily. 03/07/21  Yes Bhagat, Bhavinkumar, PA  nitroGLYCERIN (NITROSTAT) 0.4 MG SL tablet Place 1 tablet (0.4 mg total) under the tongue every 5 (five) minutes x 3 doses as needed for chest pain. 03/07/21  Yes Bhagat, Bhavinkumar, PA  omeprazole (PRILOSEC) 40 MG capsule Take 40 mg by mouth daily.   Yes [provider]  PARoxetine (PAXIL) 40 MG tablet Take 40 mg by mouth daily. 02/01/18  Yes  [provider]  predniSONE (DELTASONE) 10 MG tablet Take 2 tablets (20 mg total) by mouth daily. 07/20/22  Yes Bethann Berkshire, MD  amoxicillin-clavulanate (AUGMENTIN) 875-125 MG tablet Take 1 tablet by mouth every 12 (twelve) hours. Patient not taking: Reported on 07/20/2022 11/13/21   Particia Nearing, PA-C  ezetimibe (ZETIA) 10 MG tablet Take 1 tablet (10 mg total) by mouth daily. 05/26/21 08/24/21  Runell Gess, MD  promethazine-dextromethorphan (PROMETHAZINE-DM) 6.25-15 MG/5ML syrup Take 5 mLs by mouth 3 (three) times daily as needed for cough. Patient not taking: Reported on 07/20/2022 01/01/22   Wallis Bamberg, PA-C  ticagrelor (BRILINTA) 90 MG TABS tablet Take 1 tablet (90 mg total) by mouth 2 (two) times daily. Patient not taking: Reported on 07/20/2022 03/07/21   Manson Passey, PA      Allergies    Augmentin [amoxicillin-pot clavulanate]    Review of Systems   Review of Systems  Genitourinary:  Positive for flank pain.    Physical Exam Updated Vital Signs BP 117/76   Pulse (!) 56   Temp 97.7 F (36.5 C) (Oral)   Resp 17   SpO2 98%  Physical Exam  ED Results / Procedures / Treatments   Labs (all labs ordered are listed, but only abnormal results are displayed) Labs Reviewed  COMPREHENSIVE METABOLIC PANEL - Abnormal; Notable for the following components:      Result Value   Glucose, Bld 112 (*)  Calcium 8.7 (*)    All other components within normal limits  URINALYSIS, ROUTINE W REFLEX MICROSCOPIC  CBC WITH DIFFERENTIAL/PLATELET    EKG None  Radiology CT Renal Stone Study  Result Date: 07/20/2022 CLINICAL DATA:  Left flank pain for 3 days. EXAM: CT ABDOMEN AND PELVIS WITHOUT CONTRAST TECHNIQUE: Multidetector CT imaging of the abdomen and pelvis was performed following the standard protocol without IV contrast. RADIATION DOSE REDUCTION: This exam was performed according to the departmental dose-optimization program which includes automated  exposure control, adjustment of the mA and/or kV according to patient size and/or use of iterative reconstruction technique. COMPARISON:  None Available. FINDINGS: Lower chest: No acute abnormality. Hepatobiliary: No focal liver abnormality is seen. Prior cholecystectomy. No biliary ductal dilatation. Pancreas: Unremarkable. No pancreatic ductal dilatation or surrounding inflammatory changes. Spleen: Normal in size without focal abnormality. Adrenals/Urinary Tract: Adrenal glands are unremarkable. Kidneys are normal, without renal calculi, focal lesion, or hydronephrosis. Bladder is unremarkable. Stomach/Bowel: Stomach is within normal limits. No evidence of bowel wall thickening, distention, or inflammatory changes. Appendix is normal. Vascular/Lymphatic: No significant vascular findings are present. No enlarged abdominal or pelvic lymph nodes. Reproductive: Prostate is unremarkable. Other: No abdominal wall hernia or abnormality. No abdominopelvic ascites. Musculoskeletal: No acute osseous abnormality. No aggressive osseous lesion. Severe osteoarthritis of the right hip. Mild osteoarthritis of the left hip. IMPRESSION: 1. No acute abdominal or pelvic pathology. 2. No urolithiasis or obstructive uropathy. Electronically Signed   By: Kathreen Devoid M.D.   On: 07/20/2022 09:38    Procedures Procedures  {Document cardiac monitor, telemetry assessment procedure when appropriate:1}  Medications Ordered in ED Medications - No data to display  ED Course/ Medical Decision Making/ A&P                           Medical Decision Making Amount and/or Complexity of Data Reviewed Labs: ordered. Radiology: ordered.  Risk Prescription drug management.   Patient with back pain.  Most likely muscle skeletal.  She will follow-up with Ortho  {Document critical care time when appropriate:1} {Document review of labs and clinical decision tools ie heart score, Chads2Vasc2 etc:1}  {Document your independent review  of radiology images, and any outside records:1} {Document your discussion with family members, caretakers, and with consultants:1} {Document social determinants of health affecting pt's care:1} {Document your decision making why or why not admission, treatments were needed:1} Final Clinical Impression(s) / ED Diagnoses Final diagnoses:  None    Rx / DC Orders ED Discharge Orders          Ordered    predniSONE (DELTASONE) 10 MG tablet  Daily        07/20/22 1012    HYDROcodone-acetaminophen (NORCO/VICODIN) 5-325 MG tablet        07/20/22 1012

## 2022-07-20 NOTE — ED Notes (Signed)
Pt in ct 

## 2024-03-04 ENCOUNTER — Emergency Department (HOSPITAL_COMMUNITY)

## 2024-03-04 ENCOUNTER — Emergency Department (HOSPITAL_COMMUNITY)
Admission: EM | Admit: 2024-03-04 | Discharge: 2024-03-05 | Disposition: A | Discharge status: Home / Self Care | Attending: Emergency Medicine | Admitting: Emergency Medicine

## 2024-03-04 ENCOUNTER — Other Ambulatory Visit: Payer: Self-pay

## 2024-03-04 ENCOUNTER — Encounter (HOSPITAL_COMMUNITY): Payer: Self-pay

## 2024-03-04 DIAGNOSIS — R072 Precordial pain: Secondary | ICD-10-CM | POA: Diagnosis present

## 2024-03-04 DIAGNOSIS — I2510 Atherosclerotic heart disease of native coronary artery without angina pectoris: Secondary | ICD-10-CM | POA: Insufficient documentation

## 2024-03-04 DIAGNOSIS — Z7982 Long term (current) use of aspirin: Secondary | ICD-10-CM | POA: Diagnosis not present

## 2024-03-04 LAB — BASIC METABOLIC PANEL WITH GFR
Anion gap: 12 (ref 5–15)
BUN: 16 mg/dL (ref 6–20)
CO2: 24 mmol/L (ref 22–32)
Calcium: 9.5 mg/dL (ref 8.9–10.3)
Chloride: 106 mmol/L (ref 98–111)
Creatinine, Ser: 1.14 mg/dL (ref 0.61–1.24)
GFR, Estimated: >60 mL/min (ref >60)
Glucose, Bld: 122 mg/dL — ABNORMAL HIGH (ref 70–99)
Potassium: 4.3 mmol/L (ref 3.5–5.1)
Sodium: 142 mmol/L (ref 135–145)

## 2024-03-04 LAB — CBC
HCT: 45.8 % (ref 39.0–52.0)
Hemoglobin: 15.3 g/dL (ref 13.0–17.0)
MCH: 29.4 pg (ref 26.0–34.0)
MCHC: 33.4 g/dL (ref 30.0–36.0)
MCV: 88.1 fL (ref 80.0–100.0)
Platelets: 263 10*3/uL (ref 150–400)
RBC: 5.2 MIL/uL (ref 4.22–5.81)
RDW: 13.7 % (ref 11.5–15.5)
WBC: 7.9 10*3/uL (ref 4.0–10.5)
nRBC: 0 % (ref 0.0–0.2)

## 2024-03-04 LAB — TROPONIN I (HIGH SENSITIVITY)
Troponin I (High Sensitivity): 2 ng/L (ref <18)
Troponin I (High Sensitivity): <2 ng/L (ref <18)

## 2024-03-04 MED ORDER — ASPIRIN 81 MG PO CHEW
324.0000 mg | CHEWABLE_TABLET | Freq: Once | ORAL | Status: AC
  Administered 2024-03-04: 324 mg via ORAL
  Filled 2024-03-04: qty 4

## 2024-03-04 NOTE — ED Provider Notes (Signed)
 [place] EMERGENCY DEPARTMENT AT [facility] Provider Note   CSN: [id number] Arrival date & time: 03/04/24  2116     History  Chief Complaint  Patient presents with   Chest Pain    Aaron Rosario is a 58 y.o. male.  The history is provided by the patient and a significant other.  Patient with history of CAD presents with chest pain.  Patient reports after leaving work he started having chest tightness but no shortness of breath.  No diaphoresis, no vomiting.  Lasted around 2 hours and has since resolved.  He is compliant with all of his daily medications.  He is followed by the [facility] for his cardiology care, had a cardiac cath back in 2022 that revealed CAD  He is now essentially back to baseline He does not get chest pain frequently     Home Medications Prior to Admission medications   Medication Sig Start Date End Date Taking? Authorizing Provider  aspirin  81 MG EC tablet Take 1 tablet (81 mg total) by mouth daily. Swallow whole. 03/08/21  Yes Bhagat, Bhavinkumar, PA  atorvastatin  (LIPITOR ) 80 MG tablet Take 1 tablet (80 mg total) by mouth daily. 03/08/21  Yes Bhagat, Bhavinkumar, PA  busPIRone  (BUSPAR ) 10 MG tablet Take 10 mg by mouth 2 (two) times daily. 11/18/20  Yes [provider]  Cholecalciferol (VITAMIN D3) 50 MCG (2000 UT) capsule Take 2,000 Units by mouth daily.   Yes [provider]  clonazePAM  (KLONOPIN ) 1 MG tablet Take 1 mg by mouth 2 (two) times daily as needed for anxiety.   Yes [provider]  ezetimibe  (ZETIA ) 10 MG tablet Take 1 tablet (10 mg total) by mouth daily. Patient taking differently: Take 5 mg by mouth in the morning and at bedtime. 05/26/21 03/04/24 Yes Avanell Leigh, MD  metoprolol  tartrate (LOPRESSOR ) 25 MG tablet Take 0.5 tablets (12.5 mg total) by mouth 2 (two) times daily. 03/07/21  Yes Bhagat, Bhavinkumar, PA  nitroGLYCERIN  (NITROSTAT ) 0.4 MG SL tablet Place 1 tablet (0.4 mg total) under the tongue every 5  (five) minutes x 3 doses as needed for chest pain. 03/07/21  Yes Bhagat, Bhavinkumar, PA  omeprazole (PRILOSEC) 40 MG capsule Take 40 mg by mouth daily.   Yes [provider]  PARoxetine  (PAXIL ) 40 MG tablet Take 40 mg by mouth daily. 02/01/18  Yes [provider]  vitamin B-12 (CYANOCOBALAMIN) 500 MCG tablet Take 500 mcg by mouth daily.   Yes [provider]      Allergies    Augmentin  [amoxicillin -pot clavulanate] and Erythromycin    Review of Systems   Review of Systems  Constitutional:  Negative for diaphoresis and fever.  Respiratory:  Negative for shortness of breath.   Cardiovascular:  Positive for chest pain.    Physical Exam Updated Vital Signs BP 118/64   Pulse (!) 59   Temp 97.9 F (36.6 C) (Oral)   Resp 16   Ht 1.753 m (5' 9)   Wt 122.5 kg   SpO2 97%   BMI 39.87 kg/m  Physical Exam CONSTITUTIONAL: Well developed/well nourished, no distress HEAD: Normocephalic/atraumatic ENMT: Mucous membranes moist NECK: supple no meningeal signs CV: S1/S2 noted, no murmurs/rubs/gallops noted LUNGS: Lungs are clear to auscultation bilaterally, no apparent distress ABDOMEN: soft, nontender NEURO: Pt is awake/alert/appropriate, moves all extremitiesx4.  No facial droop.   EXTREMITIES: pulses normal/equalx4, full ROM SKIN: warm, color normal PSYCH: no abnormalities of mood noted, alert and oriented to situation  ED  Results / Procedures / Treatments   Labs (all labs ordered are listed, but only abnormal results are displayed) Labs Reviewed  BASIC METABOLIC PANEL WITH GFR - Abnormal; Notable for the following components:      Result Value   Glucose, Bld 122 (*)    All other components within normal limits  CBC  TROPONIN I (HIGH SENSITIVITY)  TROPONIN I (HIGH SENSITIVITY)    EKG EKG Interpretation Date/Time:  Wednesday March 04 2024 23:22:43 EDT Ventricular Rate:  57 PR Interval:  172 QRS Duration:  107 QT Interval:  471 QTC  Calculation: 459 R Axis:   -4  Text Interpretation: Sinus rhythm Low voltage, precordial leads Abnormal R-wave progression, late transition Confirmed by Eldon Greenland ([id number]) on 03/04/2024 11:34:10 PM  Radiology DG Chest 2 View Result Date: 03/04/2024 CLINICAL DATA:  Chest pain EXAM: CHEST - 2 VIEW COMPARISON:  01/01/2022 FINDINGS: The heart size and mediastinal contours are within normal limits. Both lungs are clear. The visualized skeletal structures are unremarkable. IMPRESSION: No active cardiopulmonary disease. Electronically Signed   By: Rozell Cornet M.D.   On: 03/04/2024 22:20    Procedures Procedures    Medications Ordered in ED Medications  aspirin  chewable tablet 324 mg (324 mg Oral Given 03/04/24 2335)    ED Course/ Medical Decision Making/ A&P Clinical Course as of 03/05/24 0011  Thu Mar 05, 2024  0011 Overall patient is well-appearing, no acute distress.  Has a previous history of CAD but is been well-controlled.  He is back to baseline here.  Initial workup is reassuring.  He has no chest pain at this time.  I had a long discussion with patient risk and benefits of admission versus discharge.  Patient prefers to be discharged and will contact his cardiologist at the [facility] tomorrow morning.  Discussed strict ER return precautions and when to call 911 with patient and his fiance, these were listed in his discharge paperwork [DW]    Clinical Course User Index [DW] Eldon Greenland, MD             HEART Score: 4                    Medical Decision Making Amount and/or Complexity of Data Reviewed Labs: ordered. Radiology: ordered. ECG/medicine tests: ordered.  Risk OTC drugs.   This patient presents to the ED for concern of chest pain, this involves an extensive number of treatment options, and is a complaint that carries with it a high risk of complications and morbidity.  The differential diagnosis includes but is not limited to acute coronary syndrome,  aortic dissection, pulmonary embolism, pericarditis, pneumothorax, pneumonia, myocarditis, pleurisy, esophageal rupture    Comorbidities that complicate the patient evaluation: Patient's presentation is complicated by their history of CAD  Social Determinants of Health: Patient's veteran status  increases the complexity of managing their presentation  Additional history obtained: Additional history obtained from significant other Records reviewed previous admission documents  Lab Tests: I Ordered, and personally interpreted labs.  The pertinent results include: Labs unremarkable  Imaging Studies ordered: I ordered imaging studies including X-ray chest  I independently visualized and interpreted imaging which showed no acute findings I agree with the radiologist interpretation  Medicines ordered and prescription drug management: I ordered medication including aspirin  for pain   Test Considered: Patient offered admission, but he prefers to be discharged   Reevaluation: After the interventions noted above, I reevaluated the patient and found that they have :improved  Complexity of problems addressed: Patient's presentation is most consistent with  acute presentation with potential threat to life or bodily function  Disposition: After consideration of the diagnostic results and the patient's response to treatment,  I feel that the patent would benefit from discharge  .           Final Clinical Impression(s) / ED Diagnoses Final diagnoses:  Precordial pain    Rx / DC Orders ED Discharge Orders     None         Eldon Greenland, MD 03/05/24 [phone]

## 2024-03-04 NOTE — ED Notes (Signed)
 Patient transported to X-ray

## 2024-03-04 NOTE — ED Triage Notes (Signed)
 Pov from home. Cc of chest pain 2 hours ago. Center chest pain. Pressure. Like someone is sitting on it.  Had heart attack 2 years ago feels similar.

## 2024-03-04 NOTE — ED Notes (Signed)
 Provider at bedside

## 2024-03-05 NOTE — Discharge Instructions (Signed)

## 2024-03-14 ENCOUNTER — Encounter (HOSPITAL_COMMUNITY): Payer: Self-pay | Admitting: Radiology

## 2024-03-14 ENCOUNTER — Emergency Department (HOSPITAL_COMMUNITY)

## 2024-03-14 ENCOUNTER — Emergency Department (HOSPITAL_COMMUNITY)
Admission: EM | Admit: 2024-03-14 | Discharge: 2024-03-15 | Disposition: A | Discharge status: Home / Self Care | Attending: Emergency Medicine | Admitting: Emergency Medicine

## 2024-03-14 ENCOUNTER — Other Ambulatory Visit: Payer: Self-pay

## 2024-03-14 DIAGNOSIS — Z7982 Long term (current) use of aspirin: Secondary | ICD-10-CM | POA: Diagnosis not present

## 2024-03-14 DIAGNOSIS — R002 Palpitations: Secondary | ICD-10-CM | POA: Diagnosis not present

## 2024-03-14 DIAGNOSIS — R079 Chest pain, unspecified: Secondary | ICD-10-CM

## 2024-03-14 DIAGNOSIS — R0789 Other chest pain: Secondary | ICD-10-CM | POA: Insufficient documentation

## 2024-03-14 LAB — CBC
HCT: 42.7 % (ref 39.0–52.0)
Hemoglobin: 14.6 g/dL (ref 13.0–17.0)
MCH: 29.4 pg (ref 26.0–34.0)
MCHC: 34.2 g/dL (ref 30.0–36.0)
MCV: 85.9 fL (ref 80.0–100.0)
Platelets: 230 10*3/uL (ref 150–400)
RBC: 4.97 MIL/uL (ref 4.22–5.81)
RDW: 13.3 % (ref 11.5–15.5)
WBC: 8.6 10*3/uL (ref 4.0–10.5)
nRBC: 0 % (ref 0.0–0.2)

## 2024-03-14 LAB — BASIC METABOLIC PANEL WITH GFR
Anion gap: 7 (ref 5–15)
BUN: 12 mg/dL (ref 6–20)
CO2: 20 mmol/L — ABNORMAL LOW (ref 22–32)
Calcium: 7.1 mg/dL — ABNORMAL LOW (ref 8.9–10.3)
Chloride: 115 mmol/L — ABNORMAL HIGH (ref 98–111)
Creatinine, Ser: 0.79 mg/dL (ref 0.61–1.24)
GFR, Estimated: >60 mL/min (ref >60)
Glucose, Bld: 87 mg/dL (ref 70–99)
Potassium: 2.7 mmol/L — CL (ref 3.5–5.1)
Sodium: 142 mmol/L (ref 135–145)

## 2024-03-14 LAB — TROPONIN I (HIGH SENSITIVITY): Troponin I (High Sensitivity): 4 ng/L (ref <18)

## 2024-03-14 MED ORDER — POTASSIUM CHLORIDE CRYS ER 20 MEQ PO TBCR
80.0000 meq | EXTENDED_RELEASE_TABLET | Freq: Once | ORAL | Status: AC
  Administered 2024-03-15: 80 meq via ORAL
  Filled 2024-03-14: qty 4

## 2024-03-14 NOTE — ED Notes (Signed)
 Critical Potassium 2.7. MD made aware.

## 2024-03-14 NOTE — ED Triage Notes (Addendum)
 Pt from home with complaints of chest pressure  normally about 30 mine after eating. Pt has not had his gallbladder since 2000. Tonight he felt like he was having a lot of palpitations. Pt states he takes omeprazole daily.

## 2024-03-15 LAB — TROPONIN I (HIGH SENSITIVITY): Troponin I (High Sensitivity): <2 ng/L (ref <18)

## 2024-03-15 NOTE — Discharge Instructions (Signed)
 Dear Evalene Galley,  Thank you for visiting today. Your workup was reassuring. Here is a summary of the key instructions:  Follow-up: - Follow up with your cardiologist in July - Return to the emergency department if:   - Your symptoms get worse   - You have new life-threatening symptoms  Medications: - Continue taking your current medications:   - Omeprazole   - Metoprolol    - Anxiety medication   - Cholesterol medication   - B12   - Baby aspirin   Lifestyle: - Maintain a heart-healthy diet - Stay active as tolerated   Best Regards,  Ubaldo High,  Emergency Medicine

## 2024-03-15 NOTE — ED Provider Notes (Signed)
 [place] EMERGENCY DEPARTMENT AT [facility] Provider Note   CSN: [id number] Arrival date & time: 03/14/24  2220     Patient presents with: Chest Pain   Aaron Rosario is a 58 y.o. male. Aaron Rosario is a 58 year old male with a history of NSTEMI, hyperlipidemia, obesity, anxiety, and previous cardiac stenting who presents with chest pressure and palpitations. The symptoms began approximately 30 minutes after eating dinner around 8 PM. The patient initially rated the chest pressure as 6-7/10 in severity, which has since decreased to 3/10. He denies shortness of breath, nausea, vomiting, or radiating pain.  Aaron Rosario reports experiencing a similar episode two weeks ago, for which he was seen at [facility] ER. No significant findings were reported from that visit. [facility] and last saw his cardiologist about a year and a half ago. His medical history is significant for stent placement a few years ago by Dr. Wadie, as well as a left-sided heart catheterization with coronary angiography in 2023.  The patient's current medications include omeprazole, metoprolol , anxiety medication, cholesterol medication, B12, and baby aspirin .  Medical History - Anxiety - Obesity - Hyperlipidemia - NSTEMI - Left-sided heart catheterization with coronary angiography in 2023 - Emergency room visit at [facility] ER two weeks ago for similar symptoms    Chest Pain      Prior to Admission medications   Medication Sig Start Date End Date Taking? Authorizing Provider  aspirin  81 MG EC tablet Take 1 tablet (81 mg total) by mouth daily. Swallow whole. 03/08/21   Bhagat, Aleene, PA  atorvastatin  (LIPITOR ) 80 MG tablet Take 1 tablet (80 mg total) by mouth daily. 03/08/21   Bhagat, Aleene, PA  busPIRone  (BUSPAR ) 10 MG tablet Take 10 mg by mouth 2 (two) times daily. 11/18/20   [provider]  Cholecalciferol (VITAMIN D3) 50 MCG (2000  UT) capsule Take 2,000 Units by mouth daily.    [provider]  clonazePAM  (KLONOPIN ) 1 MG tablet Take 1 mg by mouth 2 (two) times daily as needed for anxiety.    [provider]  ezetimibe  (ZETIA ) 10 MG tablet Take 1 tablet (10 mg total) by mouth daily. Patient taking differently: Take 5 mg by mouth in the morning and at bedtime. 05/26/21 03/04/24  Court Dorn PARAS, MD  metoprolol  tartrate (LOPRESSOR ) 25 MG tablet Take 0.5 tablets (12.5 mg total) by mouth 2 (two) times daily. 03/07/21   Bhagat, Aleene, PA  nitroGLYCERIN  (NITROSTAT ) 0.4 MG SL tablet Place 1 tablet (0.4 mg total) under the tongue every 5 (five) minutes x 3 doses as needed for chest pain. 03/07/21   Bhagat, Bhavinkumar, PA  omeprazole (PRILOSEC) 40 MG capsule Take 40 mg by mouth daily.    [provider]  PARoxetine  (PAXIL ) 40 MG tablet Take 40 mg by mouth daily. 02/01/18   [provider]  vitamin B-12 (CYANOCOBALAMIN) 500 MCG tablet Take 500 mcg by mouth daily.    [provider]    Allergies: Augmentin  [amoxicillin -pot clavulanate] and Erythromycin    Review of Systems  Cardiovascular:  Positive for chest pain.    Updated Vital Signs BP 122/74   Pulse (!) 59   Temp 98.5 F (36.9 C) (Oral)   Resp 12   Ht 5' 10 (1.778 m)   Wt 123.4 kg   SpO2 97%   BMI 39.03 kg/m   Physical Exam Vitals and nursing note reviewed.  Constitutional:  General: He is not in acute distress.    Appearance: He is well-developed.  HENT:     Head: Normocephalic and atraumatic.   Eyes:     Conjunctiva/sclera: Conjunctivae normal.    Cardiovascular:     Rate and Rhythm: Normal rate and regular rhythm.  Pulmonary:     Effort: Pulmonary effort is normal. No respiratory distress.     Breath sounds: Normal breath sounds.  Chest:     Chest wall: No tenderness.  Abdominal:     Palpations: Abdomen is soft.     Tenderness: There is no abdominal tenderness.   Musculoskeletal:         General: No swelling.     Cervical back: Neck supple.   Skin:    General: Skin is warm and dry.     Capillary Refill: Capillary refill takes less than 2 seconds.   Neurological:     Mental Status: He is alert.   Psychiatric:        Mood and Affect: Mood normal.     (all labs ordered are listed, but only abnormal results are displayed) Labs Reviewed  BASIC METABOLIC PANEL WITH GFR - Abnormal; Notable for the following components:      Result Value   Potassium 2.7 (*)    Chloride 115 (*)    CO2 20 (*)    Calcium  7.1 (*)    All other components within normal limits  CBC  TROPONIN I (HIGH SENSITIVITY)  TROPONIN I (HIGH SENSITIVITY)    EKG: EKG Interpretation Date/Time:  Saturday March 14 2024 22:30:46 EDT Ventricular Rate:  72 PR Interval:  166 QRS Duration:  104 QT Interval:  394 QTC Calculation: 432 R Axis:   -75  Text Interpretation: Sinus rhythm LAD, consider left anterior fascicular block Abnormal R-wave progression, late transition No significant change since last tracing Confirmed by Randol Simmonds [phone]) on 03/14/2024 10:54:25 PM  Radiology: [facility] Chest 2 View Result Date: 03/14/2024 CLINICAL DATA:  Chest pain EXAM: CHEST - 2 VIEW COMPARISON:  Chest x-ray 02/29/2024 FINDINGS: The heart size and mediastinal contours are within normal limits. Both lungs are clear. The visualized skeletal structures are unremarkable. IMPRESSION: No active cardiopulmonary disease. Electronically Signed   By: Greig Pique M.D.   On: 03/14/2024 23:26     Procedures   Medications Ordered in the ED  potassium chloride SA (KLOR-CON M) CR tablet 80 mEq (80 mEq Oral Given 03/15/24 0006)                                    Medical Decision Making Amount and/or Complexity of Data Reviewed Labs: ordered. Radiology: ordered.  Risk Prescription drug management.   This patient presents to the ED for concern of chest pain, this involves an extensive number of treatment options, and is a  complaint that carries with it a high risk of complications and morbidity.  The differential diagnosis includes ACS, pneumonia, musculoskeletal pain, GERD, anxiety, PE, others   Co morbidities / Chronic conditions that complicate the patient evaluation  History of NSTEMI   Additional history obtained:  Additional history obtained from EMR External records from outside source obtained and reviewed including primary care note   Lab Tests:  I Ordered, and personally interpreted labs.  The pertinent results include: Initial troponin 4, repeat less than 2, unremarkable CBC, BMP with a potassium of 2.7   Imaging Studies ordered:  I ordered  imaging studies including chest x-ray I independently visualized and interpreted imaging which showed no active disease I agree with the radiologist interpretation   Cardiac Monitoring: / EKG:  The patient was maintained on a cardiac monitor.  I personally viewed and interpreted the cardiac monitored which showed an underlying rhythm of: Sinus rhythm   Problem List / ED Course / Critical interventions / Medication management   I ordered medication including potassium Reevaluation of the patient after these medicines showed that the patient stayed the same I have reviewed the patients home medicines and have made adjustments as needed    Social Determinants of Health:  [facility]   Test / Admission - Considered:  Patient with negative troponins x 2, nonischemic EKG.  No sign of ACS at this time.  He had hypokalemia treated with.  No associated significant EKG changes. He is not short of breath, has no sharp pain, no pain with breathing, is not tachycardic.  Presentation not consistent with a pulmonary embolism.  No pneumonia on chest x-ray.  Pain is not easily reproducible, no history to suggest musculoskeletal cause.   At this time the patient appears stable for discharge home. [facility].  I see no indication at this time that would require admission.  Patient does have an elevated heart score but is not interested in observation at this time.  His symptoms have subsided on their own without intervention.  Return precautions have been provided.      Final diagnoses:  Chest pain, unspecified type    ED Discharge Orders     None          Aaron Rosario 03/15/24 0139    Jerral Meth, MD 03/15/24 2308

## 2024-04-14 NOTE — H&P (View-Only) (Signed)
 Cardiology Office Note   Date:  04/15/2024  ID:  Aaron Rosario, DOB [birth year], MRN [id number] PCP: [facility] Providers Cardiologist:  Dorn Lesches, MD     History of Present Illness Aaron Rosario is a 58 y.o. male with a past medical history of CAD, OSA, obesity, HLD. He is followed by cardiology through the [place]. Presents today to reestablish care after not being seen by [facility] in 3 years   Patient previously had NSTEMI in 02/2021. Underwent cardiac catheterization that that time that showed severe single vessel CAD involving the mid and distal circumflex, with critical stenosis of the distal circumflex as the suspected culprit lesion for ACD. Treated with DES in the mid curcumflex and DES in the distal circumflex. There was otherwise nonobstructive RCA and LAD plaquing without significant flow-obstructive stenoses. Treated with ASA, brilinta  for 12 months. Echocardiogram 02/2021 showed EF 55-60%, no regional wall motion abnormalities, moderate LVH, normal RV systolic function, normal PA systolic function, no significant valvular abnormalities.   Has been on ASA 81 mg daily, lipitor  80 mg daily, zetia  10 mg daily, metoprolol  tartrate 12.5 mg BID   He was seen in the ED on 6/11 with chest pain. hsTn negative x2. CXR was without acute disease. EKG showed sinus rhythm, nonspecific IVCD. Again seen in the ED with chest pain 6/21. Hstn negative. EKG nonischemic. CXR without active disease.   Patient had been seen by the [facility] 04/07/24. Reported increasing chest pain. Described the pain as pressure with occasional palpitations, located under his sternum. Discussed nuclear stress test vs cath. Patient was interested in pursing cath.   Today, patient presents to further discuss cardiac catheterization for evaluation of chest discomfort.  He reports that for the past 8 months he has been having episodes of tightness in his chest.  Symptoms have worsened in the  past month or so. Reports that tightness worsens with exertion and goes away if he rests.  Feels similar to what he had prior to his heart attack, but not nearly as intense. Pain is not pleuritic or positional. He denies shortness of breath. One night he had an episode of palpitations and chest discomfort that was worse than usual, so he went to the ED for evaluation.  Since then, he has continued to have milder and brief or episodes of palpitations.  He was seen in the [place] by a cardiologist for the symptoms.  They discussed nuclear stress test versus cardiac catheterization, and favored cardiac catheterization   Studies Reviewed Cardiac Studies & Procedures   ______________________________________________________________________________________________ CARDIAC CATHETERIZATION  CARDIAC CATHETERIZATION 03/06/2021  Conclusion  Prox RCA lesion is 40% stenosed.  Mid Cx to Dist Cx lesion is 95% stenosed.  Mid Cx lesion is 70% stenosed.  A drug-eluting stent was successfully placed using a SYNERGY XD 2.50X20.  Post intervention, there is a 0% residual stenosis.  A drug-eluting stent was successfully placed using a SYNERGY XD 3.0X12.  Post intervention, there is a 0% residual stenosis.  Mid RCA lesion is 30% stenosed.  1. 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 Successful PCI using a 3.0x12 mm Synergy DES in the mid circumflex, reducing 70% stenosis to 0%, and a 2.5x20 mm Synergy DES in the distal circumflex, reducing 95% stenosis to 0%. 3. Nonobstructive RCA and LAD plaquing without significant flow-obstructive stenoses 4. Normal LVEDP  Recommend: DAPT with ASA and  ticagrelor  x 12 months (ACS Class 1 recommendation), aggressive risk reduction  Findings Coronary Findings Diagnostic  Dominance: Right  Left Anterior Descending There is mild diffuse disease throughout the  vessel.  First Diagonal Branch There is mild disease in the vessel.  Second Diagonal Branch There is mild disease in the vessel.  Left Circumflex Mid Cx lesion is 70% stenosed. The lesion is eccentric. Mid Cx to Dist Cx lesion is 95% stenosed. The lesion is eccentric. Culprit lesion  Right Coronary Artery Vessel is large. There is mild diffuse disease throughout the vessel. Large, dominant vessel with no obstructive disease. There is an eccentric, irregular plaque in the proximal portion of the RCA. Prox RCA lesion is 40% stenosed. The lesion is focal and eccentric. Mid RCA lesion is 30% stenosed.  Intervention  Mid Cx lesion Stent CATH LAUNCHER 6FR EBU3.5 guide catheter was inserted. Lesion crossed with guidewire using a WIRE COUGAR XT STRL 190CM. Pre-stent angioplasty was not performed. A drug-eluting stent was successfully placed using a SYNERGY XD 3.0X12. Maximum pressure: 16 atm. Post-stent angioplasty was not performed. Post-Intervention Lesion Assessment The intervention was successful. Pre-interventional TIMI flow is 3. Post-intervention TIMI flow is 3. No complications occurred at this lesion. There is a 0% residual stenosis post intervention.  Mid Cx to Dist Cx lesion Stent CATH LAUNCHER 6FR EBU3.5 guide catheter was inserted. Lesion crossed with guidewire using a WIRE COUGAR XT STRL 190CM. Pre-stent angioplasty was performed using a BALLOON SAPPHIRE 2.0X12. A drug-eluting stent was successfully placed using a SYNERGY XD 2.50X20. Maximum pressure: 12 atm. Post-stent angioplasty was performed using a BALLOON SAPPHIRE [place] [id number]. Maximum pressure:  16 atm. Post-Intervention Lesion Assessment The intervention was successful. Pre-interventional TIMI flow is 3. Post-intervention TIMI flow is 3. No complications occurred at this lesion. There is a 0% residual stenosis post intervention.     ECHOCARDIOGRAM  ECHOCARDIOGRAM COMPLETE 03/06/2021  Narrative ECHOCARDIOGRAM  REPORT    Patient Name:   Aaron Rosario Date of Exam: 03/06/2021 Medical Rec #:  [id number]          Height:       69.0 in Accession #:    [id number]         Weight:       257.5 lb Date of Birth:  May 26, 1966         BSA:          2.300 m Patient Age:    54 years           BP:           117/65 mmHg Patient Gender: M                  HR:           62 bpm. Exam Location:  Inpatient  Procedure: 2D Echo, Cardiac Doppler and Color Doppler  Indications:    Abnormal ECG  History:        Patient has no prior history of Echocardiogram examinations.  Sonographer:    EMMIE DEW RDCS Referring Phys: [id number] MUHAMMAD S KHAN  IMPRESSIONS   1. Left ventricular ejection fraction, by estimation, is 55 to 60%. The left ventricle has normal function. The left ventricle has no regional wall motion abnormalities. There is moderate left ventricular hypertrophy. Left ventricular diastolic parameters were normal. 2. Right ventricular systolic function is normal. The right ventricular size is normal. There is normal pulmonary artery systolic pressure. The estimated right ventricular systolic pressure is 25.1 mmHg. 3. The  mitral valve is normal in structure. No evidence of mitral valve regurgitation. 4. The aortic valve is tricuspid. Aortic valve regurgitation is not visualized. No aortic stenosis is present. 5. The inferior vena cava is normal in size with greater than 50% respiratory variability, suggesting right atrial pressure of 3 mmHg.  FINDINGS Left Ventricle: Left ventricular ejection fraction, by estimation, is 55 to 60%. The left ventricle has normal function. The left ventricle has no regional wall motion abnormalities. The left ventricular internal cavity size was normal in size. There is moderate left ventricular hypertrophy. Left ventricular diastolic parameters were normal.  Right Ventricle: The right ventricular size is normal. No increase in right ventricular wall thickness. Right  ventricular systolic function is normal. There is normal pulmonary artery systolic pressure. The tricuspid regurgitant velocity is 2.35 m/s, and with an assumed right atrial pressure of 3 mmHg, the estimated right ventricular systolic pressure is 25.1 mmHg.  Left Atrium: Left atrial size was normal in size.  Right Atrium: Right atrial size was normal in size.  Pericardium: Trivial pericardial effusion is present.  Mitral Valve: The mitral valve is normal in structure. No evidence of mitral valve regurgitation.  Tricuspid Valve: The tricuspid valve is normal in structure. Tricuspid valve regurgitation is trivial.  Aortic Valve: The aortic valve is tricuspid. Aortic valve regurgitation is not visualized. No aortic stenosis is present. Aortic valve mean gradient measures 3.0 mmHg. Aortic valve peak gradient measures 5.7 mmHg. Aortic valve area, by VTI measures 2.00 cm.  Pulmonic Valve: The pulmonic valve was not well visualized. Pulmonic valve regurgitation is not visualized.  Aorta: The aortic root and ascending aorta are structurally normal, with no evidence of dilitation.  Venous: The inferior vena cava is normal in size with greater than 50% respiratory variability, suggesting right atrial pressure of 3 mmHg.  IAS/Shunts: The interatrial septum was not well visualized.   LEFT VENTRICLE PLAX 2D LVIDd:         4.80 cm     Diastology LVIDs:         2.70 cm     LV e' medial:    7.83 cm/s LV PW:         1.40 cm     LV E/e' medial:  9.2 LV IVS:        1.20 cm     LV e' lateral:   9.46 cm/s LVOT diam:     2.10 cm     LV E/e' lateral: 7.6 LV SV:         48 LV SV Index:   21 LVOT Area:     3.46 cm  LV Volumes (MOD) LV vol d, MOD A2C: 85.9 ml LV vol d, MOD A4C: 98.9 ml LV vol s, MOD A2C: 46.3 ml LV vol s, MOD A4C: 48.7 ml LV SV MOD A2C:     39.6 ml LV SV MOD A4C:     98.9 ml LV SV MOD BP:      47.4 ml  RIGHT VENTRICLE RV Basal diam:  3.30 cm RV Mid diam:    2.70 cm RV S  prime:     14.60 cm/s TAPSE (M-mode): 1.6 cm  LEFT ATRIUM             Index       RIGHT ATRIUM           Index LA diam:        2.90 cm 1.26 cm/m  RA Area:     11.40  cm LA Vol (A2C):   23.7 ml 10.30 ml/m RA Volume:   25.90 ml  11.26 ml/m LA Vol (A4C):   22.8 ml 9.91 ml/m LA Biplane Vol: 24.8 ml 10.78 ml/m AORTIC VALVE                   PULMONIC VALVE AV Area (Vmax):    2.07 cm    PV Vmax:       0.81 m/s AV Area (Vmean):   1.94 cm    PV Vmean:      60.900 cm/s AV Area (VTI):     2.00 cm    PV VTI:        0.194 m AV Vmax:           119.00 cm/s PV Peak grad:  2.6 mmHg AV Vmean:          83.900 cm/s PV Mean grad:  2.0 mmHg AV VTI:            0.239 m AV Peak Grad:      5.7 mmHg AV Mean Grad:      3.0 mmHg LVOT Vmax:         71.20 cm/s LVOT Vmean:        47.100 cm/s LVOT VTI:          0.138 m LVOT/AV VTI ratio: 0.58  AORTA Ao Root diam: 3.50 cm Ao Asc diam:  3.00 cm  MITRAL VALVE               TRICUSPID VALVE MV Area (PHT): 4.89 cm    TR Peak grad:   22.1 mmHg MV Decel Time: 155 msec    TR Vmax:        235.00 cm/s MV E velocity: 72.20 cm/s MV A velocity: 55.80 cm/s  SHUNTS MV E/A ratio:  1.29        Systemic VTI:  0.14 m Systemic Diam: 2.10 cm  Lonni Nanas MD Electronically signed by Lonni Nanas MD Signature Date/Time: 03/06/2021/3:27:20 PM    Final          ______________________________________________________________________________________________       Risk Assessment/Calculations           Physical Exam VS:  BP 128/82 (BP Location: Left Arm, Patient Position: Sitting, Cuff Size: Large)   Pulse 60   Ht 5' 10 (1.778 m)   Wt 266 lb (120.7 kg)   SpO2 95%   BMI 38.17 kg/m        Wt Readings from Last 3 Encounters:  04/15/24 266 lb (120.7 kg)  03/14/24 272 lb (123.4 kg)  03/04/24 270 lb (122.5 kg)    GEN: Well nourished, well developed in no acute distress. Sitting upright on the exam table  NECK: No JVD; No carotid  bruits CARDIAC:  RRR, no murmurs, rubs, gallops. Radial pulses 2+ bilaterally  RESPIRATORY:  Clear to auscultation without rales, wheezing or rhonchi. Normal WOB on room air   ABDOMEN: Soft, non-tender, non-distended EXTREMITIES:  No edema in BLE; No deformity   ASSESSMENT AND PLAN  CAD  Chest pain  - Previously had NSTEMI in 02/2021 and received DES to the mid circumflex and distal circumflex. Echo at that time with EF 55-60% - Patient was seen in the ED on 6/11 and again 6/21 with chest pain. hsTn negative and EKG nonischemic both times. Seen by the [facility] 7/15 and reported increasing chest pain/pressure. Stress test vs cath discussed. Patient interested in cath so patient was referred to  heartcare  - On interview, patient reports having episodes of chest pain for about 8 months.  Worsening over the past month or so.  Described as chest tightness, worse with exertion and relieved with rest.  Feels similar to what he had prior to his heart attack but less intense -EKG today without ischemic changes -Symptoms concerning for progressive angina.  Agree that cardiac catheterization is warranted, confirmed with DOD Dr. Mona.  Discussed risks and benefits, patient willing to proceed.  Ordered BMP, CBC prior to study - Ordered echocardiogram - Started amlodipine  2.5 mg daily - Continue ASA 81 mg daily  - Continue metoprolol  tartrate 12.5 mg BID  - Continue lipitor  80 mg daily and zetia  10 mg daily   HTN  - BP well-controlled today in clinic.  Reports overall well-controlled at home.  Has occasional episodes of low blood pressure with systolic as low as 96 - Continue metoprolol  tartrate 12.5 mg twice daily - Start amlodipine  2.5 mg daily as above for antianginal benefits.  Discussed that if he has lightheadedness, dizziness, low blood pressure at home he can stop this medication  Palpitations - Patient reports having an episode of palpitations and chest discomfort that was more intense than usual and  was seen in the ED.  Workup in the ED normal.  He has continued to have episodes of palpitations that are more mild and brief - Ordered 14 ZIO to rule out arrhythmia - Echocardiogram as above - BMP and CBC as above  HLD  - [facility] - Continue Lipitor  80 mg daily, Zetia  10 mg daily  Dispo: [facility] [place], follow up with us  as needed   Signed, Rollo FABIENE Louder, PA-C

## 2024-04-14 NOTE — Progress Notes (Unsigned)
 Cardiology Office Note   Date:  04/15/2024  ID:  Aaron Rosario, DOB 03/21/1966, MRN [id number] PCP: [facility] Providers Cardiologist:  Dorn Lesches, MD     History of Present Illness Aaron Rosario is a 58 y.o. male with a past medical history of CAD, OSA, obesity, HLD. He is followed by cardiology through the [place]. Presents today to reestablish care after not being seen by [facility] in 3 years   Patient previously had NSTEMI in 02/2021. Underwent cardiac catheterization that that time that showed severe single vessel CAD involving the mid and distal circumflex, with critical stenosis of the distal circumflex as the suspected culprit lesion for ACD. Treated with DES in the mid curcumflex and DES in the distal circumflex. There was otherwise nonobstructive RCA and LAD plaquing without significant flow-obstructive stenoses. Treated with ASA, brilinta  for 12 months. Echocardiogram 02/2021 showed EF 55-60%, no regional wall motion abnormalities, moderate LVH, normal RV systolic function, normal PA systolic function, no significant valvular abnormalities.   Has been on ASA 81 mg daily, lipitor  80 mg daily, zetia  10 mg daily, metoprolol  tartrate 12.5 mg BID   He was seen in the ED on 6/11 with chest pain. hsTn negative x2. CXR was without acute disease. EKG showed sinus rhythm, nonspecific IVCD. Again seen in the ED with chest pain 6/21. Hstn negative. EKG nonischemic. CXR without active disease.   Patient had been seen by the [facility] 04/07/24. Reported increasing chest pain. Described the pain as pressure with occasional palpitations, located under his sternum. Discussed nuclear stress test vs cath. Patient was interested in pursing cath.   Today, patient presents to further discuss cardiac catheterization for evaluation of chest discomfort.  He reports that for the past 8 months he has been having episodes of tightness in his chest.  Symptoms have worsened in the  past month or so. Reports that tightness worsens with exertion and goes away if he rests.  Feels similar to what he had prior to his heart attack, but not nearly as intense. Pain is not pleuritic or positional. He denies shortness of breath. One night he had an episode of palpitations and chest discomfort that was worse than usual, so he went to the ED for evaluation.  Since then, he has continued to have milder and brief or episodes of palpitations.  He was seen in the [place] by a cardiologist for the symptoms.  They discussed nuclear stress test versus cardiac catheterization, and favored cardiac catheterization   Studies Reviewed Cardiac Studies & Procedures   ______________________________________________________________________________________________ CARDIAC CATHETERIZATION  CARDIAC CATHETERIZATION 03/06/2021  Conclusion  Prox RCA lesion is 40% stenosed.  Mid Cx to Dist Cx lesion is 95% stenosed.  Mid Cx lesion is 70% stenosed.  A drug-eluting stent was successfully placed using a SYNERGY XD 2.50X20.  Post intervention, there is a 0% residual stenosis.  A drug-eluting stent was successfully placed using a SYNERGY XD 3.0X12.  Post intervention, there is a 0% residual stenosis.  Mid RCA lesion is 30% stenosed.  1. 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 Successful PCI using a 3.0x12 mm Synergy DES in the mid circumflex, reducing 70% stenosis to 0%, and a 2.5x20 mm Synergy DES in the distal circumflex, reducing 95% stenosis to 0%. 3. Nonobstructive RCA and LAD plaquing without significant flow-obstructive stenoses 4. Normal LVEDP  Recommend: DAPT with ASA and  ticagrelor  x 12 months (ACS Class 1 recommendation), aggressive risk reduction  Findings Coronary Findings Diagnostic  Dominance: Right  Left Anterior Descending There is mild diffuse disease throughout the  vessel.  First Diagonal Branch There is mild disease in the vessel.  Second Diagonal Branch There is mild disease in the vessel.  Left Circumflex Mid Cx lesion is 70% stenosed. The lesion is eccentric. Mid Cx to Dist Cx lesion is 95% stenosed. The lesion is eccentric. Culprit lesion  Right Coronary Artery Vessel is large. There is mild diffuse disease throughout the vessel. Large, dominant vessel with no obstructive disease. There is an eccentric, irregular plaque in the proximal portion of the RCA. Prox RCA lesion is 40% stenosed. The lesion is focal and eccentric. Mid RCA lesion is 30% stenosed.  Intervention  Mid Cx lesion Stent CATH LAUNCHER 6FR EBU3.5 guide catheter was inserted. Lesion crossed with guidewire using a WIRE COUGAR XT STRL 190CM. Pre-stent angioplasty was not performed. A drug-eluting stent was successfully placed using a SYNERGY XD 3.0X12. Maximum pressure: 16 atm. Post-stent angioplasty was not performed. Post-Intervention Lesion Assessment The intervention was successful. Pre-interventional TIMI flow is 3. Post-intervention TIMI flow is 3. No complications occurred at this lesion. There is a 0% residual stenosis post intervention.  Mid Cx to Dist Cx lesion Stent CATH LAUNCHER 6FR EBU3.5 guide catheter was inserted. Lesion crossed with guidewire using a WIRE COUGAR XT STRL 190CM. Pre-stent angioplasty was performed using a BALLOON SAPPHIRE 2.0X12. A drug-eluting stent was successfully placed using a SYNERGY XD 2.50X20. Maximum pressure: 12 atm. Post-stent angioplasty was performed using a BALLOON SAPPHIRE [place] [id number]. Maximum pressure:  16 atm. Post-Intervention Lesion Assessment The intervention was successful. Pre-interventional TIMI flow is 3. Post-intervention TIMI flow is 3. No complications occurred at this lesion. There is a 0% residual stenosis post intervention.     ECHOCARDIOGRAM  ECHOCARDIOGRAM COMPLETE 03/06/2021  Narrative ECHOCARDIOGRAM  REPORT    Patient Name:   Aaron Rosario Stoermer Date of Exam: 03/06/2021 Medical Rec #:  [id number]          Height:       69.0 in Accession #:    [id number]         Weight:       257.5 lb Date of Birth:  [birth year]         BSA:          2.300 m Patient Age:    54 years           BP:           117/65 mmHg Patient Gender: M                  HR:           62 bpm. Exam Location:  Inpatient  Procedure: 2D Echo, Cardiac Doppler and Color Doppler  Indications:    Abnormal ECG  History:        Patient has no prior history of Echocardiogram examinations.  Sonographer:    EMMIE DEW RDCS Referring Phys: [id number] MUHAMMAD S KHAN  IMPRESSIONS   1. Left ventricular ejection fraction, by estimation, is 55 to 60%. The left ventricle has normal function. The left ventricle has no regional wall motion abnormalities. There is moderate left ventricular hypertrophy. Left ventricular diastolic parameters were normal. 2. Right ventricular systolic function is normal. The right ventricular size is normal. There is normal pulmonary artery systolic pressure. The estimated right ventricular systolic pressure is 25.1 mmHg. 3. The  mitral valve is normal in structure. No evidence of mitral valve regurgitation. 4. The aortic valve is tricuspid. Aortic valve regurgitation is not visualized. No aortic stenosis is present. 5. The inferior vena cava is normal in size with greater than 50% respiratory variability, suggesting right atrial pressure of 3 mmHg.  FINDINGS Left Ventricle: Left ventricular ejection fraction, by estimation, is 55 to 60%. The left ventricle has normal function. The left ventricle has no regional wall motion abnormalities. The left ventricular internal cavity size was normal in size. There is moderate left ventricular hypertrophy. Left ventricular diastolic parameters were normal.  Right Ventricle: The right ventricular size is normal. No increase in right ventricular wall thickness. Right  ventricular systolic function is normal. There is normal pulmonary artery systolic pressure. The tricuspid regurgitant velocity is 2.35 m/s, and with an assumed right atrial pressure of 3 mmHg, the estimated right ventricular systolic pressure is 25.1 mmHg.  Left Atrium: Left atrial size was normal in size.  Right Atrium: Right atrial size was normal in size.  Pericardium: Trivial pericardial effusion is present.  Mitral Valve: The mitral valve is normal in structure. No evidence of mitral valve regurgitation.  Tricuspid Valve: The tricuspid valve is normal in structure. Tricuspid valve regurgitation is trivial.  Aortic Valve: The aortic valve is tricuspid. Aortic valve regurgitation is not visualized. No aortic stenosis is present. Aortic valve mean gradient measures 3.0 mmHg. Aortic valve peak gradient measures 5.7 mmHg. Aortic valve area, by VTI measures 2.00 cm.  Pulmonic Valve: The pulmonic valve was not well visualized. Pulmonic valve regurgitation is not visualized.  Aorta: The aortic root and ascending aorta are structurally normal, with no evidence of dilitation.  Venous: The inferior vena cava is normal in size with greater than 50% respiratory variability, suggesting right atrial pressure of 3 mmHg.  IAS/Shunts: The interatrial septum was not well visualized.   LEFT VENTRICLE PLAX 2D LVIDd:         4.80 cm     Diastology LVIDs:         2.70 cm     LV e' medial:    7.83 cm/s LV PW:         1.40 cm     LV E/e' medial:  9.2 LV IVS:        1.20 cm     LV e' lateral:   9.46 cm/s LVOT diam:     2.10 cm     LV E/e' lateral: 7.6 LV SV:         48 LV SV Index:   21 LVOT Area:     3.46 cm  LV Volumes (MOD) LV vol d, MOD A2C: 85.9 ml LV vol d, MOD A4C: 98.9 ml LV vol s, MOD A2C: 46.3 ml LV vol s, MOD A4C: 48.7 ml LV SV MOD A2C:     39.6 ml LV SV MOD A4C:     98.9 ml LV SV MOD BP:      47.4 ml  RIGHT VENTRICLE RV Basal diam:  3.30 cm RV Mid diam:    2.70 cm RV S  prime:     14.60 cm/s TAPSE (M-mode): 1.6 cm  LEFT ATRIUM             Index       RIGHT ATRIUM           Index LA diam:        2.90 cm 1.26 cm/m  RA Area:     11.40  cm LA Vol (A2C):   23.7 ml 10.30 ml/m RA Volume:   25.90 ml  11.26 ml/m LA Vol (A4C):   22.8 ml 9.91 ml/m LA Biplane Vol: 24.8 ml 10.78 ml/m AORTIC VALVE                   PULMONIC VALVE AV Area (Vmax):    2.07 cm    PV Vmax:       0.81 m/s AV Area (Vmean):   1.94 cm    PV Vmean:      60.900 cm/s AV Area (VTI):     2.00 cm    PV VTI:        0.194 m AV Vmax:           119.00 cm/s PV Peak grad:  2.6 mmHg AV Vmean:          83.900 cm/s PV Mean grad:  2.0 mmHg AV VTI:            0.239 m AV Peak Grad:      5.7 mmHg AV Mean Grad:      3.0 mmHg LVOT Vmax:         71.20 cm/s LVOT Vmean:        47.100 cm/s LVOT VTI:          0.138 m LVOT/AV VTI ratio: 0.58  AORTA Ao Root diam: 3.50 cm Ao Asc diam:  3.00 cm  MITRAL VALVE               TRICUSPID VALVE MV Area (PHT): 4.89 cm    TR Peak grad:   22.1 mmHg MV Decel Time: 155 msec    TR Vmax:        235.00 cm/s MV E velocity: 72.20 cm/s MV A velocity: 55.80 cm/s  SHUNTS MV E/A ratio:  1.29        Systemic VTI:  0.14 m Systemic Diam: 2.10 cm  Lonni Nanas MD Electronically signed by Lonni Nanas MD Signature Date/Time: 03/06/2021/3:27:20 PM    Final          ______________________________________________________________________________________________       Risk Assessment/Calculations           Physical Exam VS:  BP 128/82 (BP Location: Left Arm, Patient Position: Sitting, Cuff Size: Large)   Pulse 60   Ht 5' 10 (1.778 m)   Wt 266 lb (120.7 kg)   SpO2 95%   BMI 38.17 kg/m        Wt Readings from Last 3 Encounters:  04/15/24 266 lb (120.7 kg)  03/14/24 272 lb (123.4 kg)  03/04/24 270 lb (122.5 kg)    GEN: Well nourished, well developed in no acute distress. Sitting upright on the exam table  NECK: No JVD; No carotid  bruits CARDIAC:  RRR, no murmurs, rubs, gallops. Radial pulses 2+ bilaterally  RESPIRATORY:  Clear to auscultation without rales, wheezing or rhonchi. Normal WOB on room air   ABDOMEN: Soft, non-tender, non-distended EXTREMITIES:  No edema in BLE; No deformity   ASSESSMENT AND PLAN  CAD  Chest pain  - Previously had NSTEMI in 02/2021 and received DES to the mid circumflex and distal circumflex. Echo at that time with EF 55-60% - Patient was seen in the ED on 6/11 and again 6/21 with chest pain. hsTn negative and EKG nonischemic both times. Seen by the [facility] 7/15 and reported increasing chest pain/pressure. Stress test vs cath discussed. Patient interested in cath so patient was referred to  heartcare  - On interview, patient reports having episodes of chest pain for about 8 months.  Worsening over the past month or so.  Described as chest tightness, worse with exertion and relieved with rest.  Feels similar to what he had prior to his heart attack but less intense -EKG today without ischemic changes -Symptoms concerning for progressive angina.  Agree that cardiac catheterization is warranted, confirmed with DOD Dr. Mona.  Discussed risks and benefits, patient willing to proceed.  Ordered BMP, CBC prior to study - Ordered echocardiogram - Started amlodipine  2.5 mg daily - Continue ASA 81 mg daily  - Continue metoprolol  tartrate 12.5 mg BID  - Continue lipitor  80 mg daily and zetia  10 mg daily   HTN  - BP well-controlled today in clinic.  Reports overall well-controlled at home.  Has occasional episodes of low blood pressure with systolic as low as 96 - Continue metoprolol  tartrate 12.5 mg twice daily - Start amlodipine  2.5 mg daily as above for antianginal benefits.  Discussed that if he has lightheadedness, dizziness, low blood pressure at home he can stop this medication  Palpitations - Patient reports having an episode of palpitations and chest discomfort that was more intense than usual and  was seen in the ED.  Workup in the ED normal.  He has continued to have episodes of palpitations that are more mild and brief - Ordered 14 ZIO to rule out arrhythmia - Echocardiogram as above - BMP and CBC as above  HLD  - [facility] - Continue Lipitor  80 mg daily, Zetia  10 mg daily  Dispo: [facility] [place], follow up with us  as needed   Signed, Rollo FABIENE Louder, PA-C

## 2024-04-15 ENCOUNTER — Other Ambulatory Visit: Payer: Self-pay | Admitting: Cardiology

## 2024-04-15 ENCOUNTER — Encounter: Payer: Self-pay | Admitting: Cardiology

## 2024-04-15 ENCOUNTER — Ambulatory Visit: Attending: Cardiology | Admitting: Cardiology

## 2024-04-15 ENCOUNTER — Ambulatory Visit

## 2024-04-15 VITALS — BP 128/82 | HR 60 | Ht 70.0 in | Wt 266.0 lb

## 2024-04-15 DIAGNOSIS — I25118 Atherosclerotic heart disease of native coronary artery with other forms of angina pectoris: Secondary | ICD-10-CM | POA: Diagnosis not present

## 2024-04-15 DIAGNOSIS — R002 Palpitations: Secondary | ICD-10-CM

## 2024-04-15 DIAGNOSIS — E785 Hyperlipidemia, unspecified: Secondary | ICD-10-CM

## 2024-04-15 DIAGNOSIS — I214 Non-ST elevation (NSTEMI) myocardial infarction: Secondary | ICD-10-CM

## 2024-04-15 DIAGNOSIS — G4710 Hypersomnia, unspecified: Secondary | ICD-10-CM | POA: Diagnosis not present

## 2024-04-15 DIAGNOSIS — R079 Chest pain, unspecified: Secondary | ICD-10-CM

## 2024-04-15 LAB — CBC

## 2024-04-15 MED ORDER — AMLODIPINE BESYLATE 2.5 MG PO TABS: 2.5000 mg | ORAL_TABLET | Freq: Every day | ORAL | 3 refills | Status: AC

## 2024-04-15 NOTE — Progress Notes (Unsigned)
 Enrolled for [employer] to mail a ZIO XT long term holter monitor to the patients address on file.   Dr. Allyson Sabal to read.

## 2024-04-15 NOTE — Patient Instructions (Addendum)
 Medication Instructions:  Start amlodipine  2.5 mg once a day  *If you need a refill on your cardiac medications before your next appointment, please call your pharmacy*  Lab Work: Today we are going to draw a Bmet and CBC If you have labs (blood work) drawn today and your tests are completely normal, you will receive your results only by: MyChart Message (if you have MyChart) OR A paper copy in the mail If you have any lab test that is abnormal or we need to change your treatment, we will call you to review the results.  Testing/Procedures: Your physician has requested that you have an echocardiogram. Echocardiography is a painless test that uses sound waves to create images of your heart. It provides your doctor with information about the size and shape of your heart and how well your heart's chambers and valves are working. This procedure takes approximately one hour. There are no restrictions for this procedure. Please do NOT wear cologne, perfume, aftershave, or lotions (deodorant is allowed). Please arrive 15 minutes prior to your appointment time.  Please note: We ask at that you not bring children with you during ultrasound (echo/ vascular) testing. Due to room size and safety concerns, children are not allowed in the ultrasound rooms during exams. Our front office staff cannot provide observation of children in our lobby area while testing is being conducted. An adult accompanying a patient to their appointment will only be allowed in the ultrasound room at the discretion of the ultrasound technician under special circumstances. We apologize for any inconvenience.   [facility] A DEPT OF [place] AT [address] A DEPT OF THE [place] [address] [place] [address] Dept: [phone] Loc: [phone]  Aaron Rosario  04/15/2024  You are scheduled for a Cardiac Catheterization on Tuesday, July 29 with Dr. Alm Clay.  1. Please arrive at the [facility] (Main Entrance A) at [facility]: [address] [place], [place] [address] at 5:30 AM (This time is 2 hour(s) before your procedure to ensure your preparation).   Free valet parking service is available. You will check in at ADMITTING. The support person will be asked to wait in the waiting room.  It is OK to have someone drop you off and come back when you are ready to be discharged.    Special note: Every effort is made to have your procedure done on time. Please understand that emergencies sometimes delay scheduled procedures.  2. Diet: Do not eat solid foods after midnight.  The patient may have clear liquids until 5am upon the day of the procedure.  3. Labs: You will need to have blood drawn on  4. Medication instructions in preparation for your procedure:   Contrast Allergy: No  On the morning of your procedure, take your Aspirin  81 mg and any morning medicines NOT listed above.  You may use sips of water.  5. Plan to go home the same day, you will only stay overnight if medically necessary. 6. Bring a current list of your medications and current insurance cards. 7. You MUST have a responsible person to drive you home. 8. Someone MUST be with you the first 24 hours after you arrive home or your discharge will be delayed. 9. Please wear clothes that are easy to get on and off and wear slip-on shoes.  Thank you for allowing us  to care for you!   -- [facility]   Follow-Up: At [facility], you and your health needs are our priority.  As part of our continuing mission to provide you with exceptional heart care, our providers are all part of one team.  This team includes your primary Cardiologist (physician) and Advanced Practice Providers or APPs (Physician Assistants and Nurse Practitioners) who all work together to provide you with the care you need, when you need it.  Your next  appointment:   As needed  We recommend signing up for the patient portal called MyChart.  Sign up information is provided on this After Visit Summary.  MyChart is used to connect with patients for Virtual Visits (Telemedicine).  Patients are able to view lab/test results, encounter notes, upcoming appointments, etc.  Non-urgent messages can be sent to your provider as well.   To learn more about what you can do with MyChart, go to [email or web address].   Other Instructions ZIO XT- Long Term Monitor Instructions  Your physician has requested you wear a ZIO patch monitor for 14 days.  This is a single patch monitor. [employer] supplies one patch monitor per enrollment. Additional stickers are not available. Please do not apply patch if you will be having a Nuclear Stress Test,  Echocardiogram, Cardiac CT, MRI, or Chest Xray during the period you would be wearing the  monitor. The patch cannot be worn during these tests. You cannot remove and re-apply the  ZIO XT patch monitor.  Your ZIO patch monitor will be mailed 3 day USPS to your address on file. It may take 3-5 days  to receive your monitor after you have been enrolled.  Once you have received your monitor, please review the enclosed instructions. Your monitor  has already been registered assigning a specific monitor serial # to you.  Billing and Patient Assistance Program Information  We have supplied [employer] with any of your insurance information on file for billing purposes. [employer] offers a sliding scale Patient Assistance Program for patients that do not have  insurance, or whose insurance does not completely cover the cost of the ZIO monitor.  You must apply for the Patient Assistance Program to qualify for this discounted rate.  To apply, please call [employer] at [phone], select option 4, select option 2, ask to apply for  Patient Assistance Program. Meredeth will ask your household income, and how many people  are in your  household. They will quote your out-of-pocket cost based on that information.  [employer] will also be able to set up a 85-month, interest-free payment plan if needed.  Applying the monitor   Shave hair from upper left chest.  Hold abrader disc by orange tab. Rub abrader in 40 strokes over the upper left chest as  indicated in your monitor instructions.  Clean area with 4 enclosed alcohol pads. Let dry.  Apply patch as indicated in monitor instructions. Patch will be placed under collarbone on left  side of chest with arrow pointing upward.  Rub patch adhesive wings for 2 minutes. Remove white label marked 1. Remove the white  label marked 2. Rub patch adhesive wings for 2 additional minutes.  While looking in a mirror, press and release button in center of patch. A small green light will  flash 3-4 times. This will be your only indicator that the monitor has been turned on.  Do not shower for the first 24 hours. You may shower after the first 24 hours.  Press the button if you feel a  symptom. You will hear a small click. Record Date, Time and  Symptom in the Patient Logbook.  When you are ready to remove the patch, follow instructions on the last 2 pages of Patient  Logbook. Stick patch monitor onto the last page of Patient Logbook.  Place Patient Logbook in the blue and white box. Use locking tab on box and tape box closed  securely. The blue and white box has prepaid postage on it. Please place it in the mailbox as  soon as possible. Your physician should have your test results approximately 7 days after the  monitor has been mailed back to [facility].  Call [facility] Customer Care at [phone] if you have questions regarding  your ZIO XT patch monitor. Call them immediately if you see an orange light blinking on your  monitor.  If your monitor falls off in less than 4 days, contact our Monitor department at [phone].  If your monitor becomes loose or falls off after  4 days call [employer] at [phone] for  suggestions on securing your monitor

## 2024-04-16 ENCOUNTER — Ambulatory Visit (HOSPITAL_COMMUNITY): Attending: Cardiovascular Disease

## 2024-04-16 ENCOUNTER — Ambulatory Visit: Payer: Self-pay | Admitting: Cardiology

## 2024-04-16 ENCOUNTER — Telehealth: Payer: Self-pay | Admitting: Cardiology

## 2024-04-16 DIAGNOSIS — R002 Palpitations: Secondary | ICD-10-CM

## 2024-04-16 LAB — BASIC METABOLIC PANEL WITH GFR
BUN/Creatinine Ratio: 10 (ref 9–20)
BUN: 9 mg/dL (ref 6–24)
CO2: 22 mmol/L (ref 20–29)
Calcium: 9.7 mg/dL (ref 8.7–10.2)
Chloride: 103 mmol/L (ref 96–106)
Creatinine, Ser: 0.88 mg/dL (ref 0.76–1.27)
Glucose: 106 mg/dL — ABNORMAL HIGH (ref 70–99)
Potassium: 5.1 mmol/L (ref 3.5–5.2)
Sodium: 140 mmol/L (ref 134–144)
eGFR: 102 mL/min/1.73 (ref >59)

## 2024-04-16 LAB — CBC
Hematocrit: 46.3 (ref 37.5–51.0)
Hemoglobin: 15.5 g/dL (ref 13.0–17.7)
MCH: 29.7 pg (ref 26.6–33.0)
MCHC: 33.5 g/dL (ref 31.5–35.7)
MCV: 89 fL (ref 79–97)
Platelets: 261 x10E3/uL (ref 150–450)
RBC: 5.22 x10E6/uL (ref 4.14–5.80)
RDW: 13.2 (ref 11.6–15.4)
WBC: 5.9 x10E3/uL (ref 3.4–10.8)

## 2024-04-16 NOTE — Telephone Encounter (Signed)
 Echo is needed to check strength of his heart and valves the Cath is just for the arteries. so yes it is still needed

## 2024-04-16 NOTE — Telephone Encounter (Signed)
 This pt is scheduled for a echo tomorrow and wants to know if it is necessary since he is already scheduled for a Cath on Wednesday. He is requesting a call from clinical to explain why the test is necessary.

## 2024-04-17 ENCOUNTER — Telehealth: Payer: Self-pay | Admitting: Cardiovascular Disease

## 2024-04-17 ENCOUNTER — Ambulatory Visit (HOSPITAL_COMMUNITY)

## 2024-04-17 ENCOUNTER — Telehealth: Payer: Self-pay | Admitting: Cardiology

## 2024-04-17 NOTE — Telephone Encounter (Signed)
 Called pt, he wanted to his cath rescheduled. Called cath schedulers. Procedure moved to August 4th with Dr. Mady. Called pt to relay message. He will still arrive at 5:30. Pt verbalized understanding, no further questions at this time.

## 2024-04-17 NOTE — Telephone Encounter (Signed)
 New Message:      Patient says he needs to cancel  his procedure on Tuesday(04-21-24). He says he would like to reschedule for the following week.

## 2024-04-17 NOTE — Telephone Encounter (Signed)
 Pt needs to r/s his cath procedure because he will be out of town for work. He had to cx his Echo too and he said this needs to also be r/s when he r/s his cath

## 2024-04-17 NOTE — Telephone Encounter (Signed)
See phone note from 7/25

## 2024-04-20 ENCOUNTER — Telehealth (HOSPITAL_COMMUNITY): Payer: Self-pay | Admitting: Cardiology

## 2024-04-20 NOTE — Telephone Encounter (Signed)
 Patient NO SHOWED 04/16/24 Echo and rescheduled for 04/17/24. Patient then called and cancelled and will call back to reschedule per scheduler; See below:  04/17/2024 12:13 PM Ab:TPOOPD, SYLVIA M  Cancel Rsn: Patient (will reschedule later)   Order will be removed from the Active echo WQ and if patient calls back we will reinstate the order. Thank you.

## 2024-04-23 ENCOUNTER — Telehealth: Payer: Self-pay | Admitting: *Deleted

## 2024-04-23 NOTE — Telephone Encounter (Signed)
 Cardiac Catheterization scheduled at [facility] for: Monday April 27, 2024 7:30 AM Arrival time [facility] Main Entrance A at: 5:30 AM  Diet: Nothing to eat after midnight prior to procedure.  Hydration: -May drink clear liquids until leaving for hospital. Approved liquids: Water, clear juice, clear tea, black coffee, fruit juices-non-citric and without pulp, carbonated beverages, Gatorade.  Drink 16 oz. bottle of water on the way to the hospital.  Medication instructions: -Usual morning medications can be taken including aspirin  81 mg.  Plan to go home the same day, you will only stay overnight if medically necessary.  You must have responsible adult to drive you home.  Someone must be with you the first 24 hours after you arrive home.  Reviewed procedure instructions with patient.

## 2024-04-27 ENCOUNTER — Ambulatory Visit
Admission: EM | Admit: 2024-04-27 | Discharge: 2024-04-27 | Disposition: A | Discharge status: Home / Self Care | Attending: Family Medicine | Admitting: Family Medicine

## 2024-04-27 ENCOUNTER — Ambulatory Visit (HOSPITAL_COMMUNITY)
Admission: RE | Admit: 2024-04-27 | Discharge: 2024-04-27 | Disposition: A | Discharge status: Home / Self Care | Attending: Internal Medicine | Admitting: Internal Medicine

## 2024-04-27 ENCOUNTER — Encounter (HOSPITAL_COMMUNITY)
Admission: RE | Disposition: A | Discharge status: Home / Self Care | Payer: Self-pay | Source: Home / Self Care | Attending: Internal Medicine

## 2024-04-27 ENCOUNTER — Encounter (HOSPITAL_COMMUNITY): Payer: Self-pay | Admitting: Internal Medicine

## 2024-04-27 ENCOUNTER — Other Ambulatory Visit: Payer: Self-pay

## 2024-04-27 DIAGNOSIS — R079 Chest pain, unspecified: Secondary | ICD-10-CM | POA: Diagnosis present

## 2024-04-27 DIAGNOSIS — E785 Hyperlipidemia, unspecified: Secondary | ICD-10-CM | POA: Insufficient documentation

## 2024-04-27 DIAGNOSIS — G4733 Obstructive sleep apnea (adult) (pediatric): Secondary | ICD-10-CM | POA: Insufficient documentation

## 2024-04-27 DIAGNOSIS — Z79899 Other long term (current) drug therapy: Secondary | ICD-10-CM | POA: Diagnosis not present

## 2024-04-27 DIAGNOSIS — R002 Palpitations: Secondary | ICD-10-CM | POA: Diagnosis not present

## 2024-04-27 DIAGNOSIS — R062 Wheezing: Secondary | ICD-10-CM | POA: Diagnosis not present

## 2024-04-27 DIAGNOSIS — Z955 Presence of coronary angioplasty implant and graft: Secondary | ICD-10-CM | POA: Insufficient documentation

## 2024-04-27 DIAGNOSIS — J069 Acute upper respiratory infection, unspecified: Secondary | ICD-10-CM

## 2024-04-27 DIAGNOSIS — I2510 Atherosclerotic heart disease of native coronary artery without angina pectoris: Secondary | ICD-10-CM | POA: Diagnosis not present

## 2024-04-27 DIAGNOSIS — I252 Old myocardial infarction: Secondary | ICD-10-CM | POA: Insufficient documentation

## 2024-04-27 DIAGNOSIS — E669 Obesity, unspecified: Secondary | ICD-10-CM | POA: Diagnosis not present

## 2024-04-27 DIAGNOSIS — Z6838 Body mass index (BMI) 38.0-38.9, adult: Secondary | ICD-10-CM | POA: Insufficient documentation

## 2024-04-27 DIAGNOSIS — L918 Other hypertrophic disorders of the skin: Secondary | ICD-10-CM

## 2024-04-27 DIAGNOSIS — Z7982 Long term (current) use of aspirin: Secondary | ICD-10-CM | POA: Insufficient documentation

## 2024-04-27 DIAGNOSIS — I10 Essential (primary) hypertension: Secondary | ICD-10-CM | POA: Diagnosis not present

## 2024-04-27 HISTORY — PX: LEFT HEART CATH AND CORONARY ANGIOGRAPHY: CATH118249

## 2024-04-27 HISTORY — PX: CORONARY PRESSURE/FFR STUDY: CATH118243

## 2024-04-27 LAB — POCT ACTIVATED CLOTTING TIME
Activated Clotting Time: 227 s
Activated Clotting Time: 268 s

## 2024-04-27 SURGERY — LEFT HEART CATH AND CORONARY ANGIOGRAPHY
Anesthesia: LOCAL

## 2024-04-27 MED ORDER — NITROGLYCERIN 1 MG/10 ML FOR IR/CATH LAB
INTRA_ARTERIAL | Status: AC
  Filled 2024-04-27: qty 10

## 2024-04-27 MED ORDER — VERAPAMIL HCL 2.5 MG/ML IV SOLN
INTRAVENOUS | Status: AC
  Filled 2024-04-27: qty 2

## 2024-04-27 MED ORDER — SODIUM CHLORIDE 0.9 % WEIGHT BASED INFUSION: 1.0000 mL/kg/h | INTRAVENOUS | Status: DC

## 2024-04-27 MED ORDER — HYDRALAZINE HCL 20 MG/ML IJ SOLN: 10.0000 mg | INTRAMUSCULAR | Status: DC | PRN

## 2024-04-27 MED ORDER — CHLORHEXIDINE GLUCONATE 4 % EX SOLN: Freq: Every day | CUTANEOUS | 0 refills | Status: AC | PRN

## 2024-04-27 MED ORDER — HEPARIN (PORCINE) IN NACL 2-0.9 UNITS/ML
INTRAMUSCULAR | Status: DC | PRN
  Administered 2024-04-27: 10 mL via INTRA_ARTERIAL

## 2024-04-27 MED ORDER — SODIUM CHLORIDE 0.9 % IV SOLN
250.0000 mL | INTRAVENOUS | Status: DC | PRN
Start: 2024-04-27 — End: 2024-04-27

## 2024-04-27 MED ORDER — ACETAMINOPHEN 325 MG PO TABS: 650.0000 mg | ORAL_TABLET | ORAL | Status: DC | PRN

## 2024-04-27 MED ORDER — LABETALOL HCL 5 MG/ML IV SOLN: 10.0000 mg | INTRAVENOUS | Status: DC | PRN

## 2024-04-27 MED ORDER — SODIUM CHLORIDE 0.9% FLUSH: 3.0000 mL | Freq: Two times a day (BID) | INTRAVENOUS | Status: DC

## 2024-04-27 MED ORDER — HEPARIN (PORCINE) IN NACL 1000-0.9 UT/500ML-% IV SOLN
INTRAVENOUS | Status: DC | PRN
Start: 2024-04-27 — End: 2024-04-27
  Administered 2024-04-27 (×3): 500 mL

## 2024-04-27 MED ORDER — SODIUM CHLORIDE 0.9 % WEIGHT BASED INFUSION: 3.0000 mL/kg/h | INTRAVENOUS | Status: AC

## 2024-04-27 MED ORDER — FENTANYL CITRATE (PF) 100 MCG/2ML IJ SOLN
INTRAMUSCULAR | Status: DC | PRN
  Administered 2024-04-27: 50 ug via INTRAVENOUS

## 2024-04-27 MED ORDER — LIDOCAINE HCL (PF) 1 % IJ SOLN
INTRAMUSCULAR | Status: DC | PRN
Start: 2024-04-27 — End: 2024-04-27
  Administered 2024-04-27: 2 mL

## 2024-04-27 MED ORDER — ASPIRIN 81 MG PO CHEW
81.0000 mg | CHEWABLE_TABLET | ORAL | Status: AC
  Administered 2024-04-27: 81 mg via ORAL
  Filled 2024-04-27: qty 1

## 2024-04-27 MED ORDER — FREE WATER: 500.0000 mL | Freq: Once | Status: DC

## 2024-04-27 MED ORDER — PROMETHAZINE-DM 6.25-15 MG/5ML PO SYRP: 5.0000 mL | ORAL_SOLUTION | Freq: Four times a day (QID) | ORAL | 0 refills | Status: AC | PRN

## 2024-04-27 MED ORDER — SODIUM CHLORIDE 0.9% FLUSH
3.0000 mL | INTRAVENOUS | Status: DC | PRN
Start: 2024-04-27 — End: 2024-04-27

## 2024-04-27 MED ORDER — HEPARIN SODIUM (PORCINE) 1000 UNIT/ML IJ SOLN
INTRAMUSCULAR | Status: AC
  Filled 2024-04-27: qty 10

## 2024-04-27 MED ORDER — MIDAZOLAM HCL 2 MG/2ML IJ SOLN
INTRAMUSCULAR | Status: AC
  Filled 2024-04-27: qty 2

## 2024-04-27 MED ORDER — NITROGLYCERIN 1 MG/10 ML FOR IR/CATH LAB
INTRA_ARTERIAL | Status: DC | PRN
  Administered 2024-04-27: 100 ug via INTRACORONARY

## 2024-04-27 MED ORDER — BACITRACIN 500 UNIT/GM EX OINT
1.0000 | TOPICAL_OINTMENT | Freq: Once | CUTANEOUS | Status: AC
  Administered 2024-04-27: 1 via TOPICAL

## 2024-04-27 MED ORDER — ONDANSETRON HCL 4 MG/2ML IJ SOLN: 4.0000 mg | Freq: Four times a day (QID) | INTRAMUSCULAR | Status: DC | PRN

## 2024-04-27 MED ORDER — ASPIRIN 81 MG PO CHEW: 81.0000 mg | CHEWABLE_TABLET | ORAL | Status: DC

## 2024-04-27 MED ORDER — MIDAZOLAM HCL 2 MG/2ML IJ SOLN
INTRAMUSCULAR | Status: DC | PRN
  Administered 2024-04-27: 1 mg via INTRAVENOUS

## 2024-04-27 MED ORDER — LIDOCAINE HCL (PF) 1 % IJ SOLN
INTRAMUSCULAR | Status: AC
Start: 2024-04-27 — End: 2024-04-27
  Filled 2024-04-27: qty 30

## 2024-04-27 MED ORDER — ALBUTEROL SULFATE HFA 108 (90 BASE) MCG/ACT IN AERS
2.0000 | INHALATION_SPRAY | RESPIRATORY_TRACT | 0 refills | Status: AC | PRN
Start: 2024-04-27 — End: ?

## 2024-04-27 MED ORDER — IOHEXOL 350 MG/ML SOLN
INTRAVENOUS | Status: DC | PRN
Start: 2024-04-27 — End: 2024-04-27
  Administered 2024-04-27: 100 mL

## 2024-04-27 MED ORDER — FENTANYL CITRATE (PF) 100 MCG/2ML IJ SOLN
INTRAMUSCULAR | Status: AC
  Filled 2024-04-27: qty 2

## 2024-04-27 MED ORDER — MUPIROCIN 2 % EX OINT: 1.0000 | TOPICAL_OINTMENT | Freq: Two times a day (BID) | CUTANEOUS | 0 refills | Status: AC

## 2024-04-27 MED ORDER — HEPARIN SODIUM (PORCINE) 1000 UNIT/ML IJ SOLN
INTRAMUSCULAR | Status: DC | PRN
  Administered 2024-04-27: 5000 [IU] via INTRAVENOUS
  Administered 2024-04-27: 7000 [IU] via INTRAVENOUS
  Administered 2024-04-27: 4000 [IU] via INTRAVENOUS

## 2024-04-27 SURGICAL SUPPLY — 12 items
CATH INFINITI 5FR ANG PIGTAIL (CATHETERS) IMPLANT
CATH INFINITI AMBI 5FR TG (CATHETERS) IMPLANT
CATH VISTA GUIDE 6FR XBLD 3.5 (CATHETERS) IMPLANT
DEVICE RAD COMP TR BAND LRG (VASCULAR PRODUCTS) IMPLANT
GLIDESHEATH SLEND SS 6F .021 (SHEATH) IMPLANT
GUIDEWIRE INQWIRE 1.5J.035X260 (WIRE) IMPLANT
GUIDEWIRE PRESSURE X 175 (WIRE) IMPLANT
KIT ESSENTIALS PG (KITS) IMPLANT
KIT SYRINGE INJ CVI SPIKEX1 (MISCELLANEOUS) IMPLANT
PACK CARDIAC CATHETERIZATION (CUSTOM PROCEDURE TRAY) ×1 IMPLANT
SET ATX-X65L (MISCELLANEOUS) IMPLANT
SHEATH PROBE COVER 6X72 (BAG) IMPLANT

## 2024-04-27 NOTE — Discharge Instructions (Signed)
 Clean the inflamed skin tag on your back once to twice daily with the Hibiclens  and apply the mupirocin  ointment and a bandage.  Follow-up for worsening or unresolving symptoms.  For your upper respiratory symptoms, I have prescribed cough syrup, albuterol  inhaler and you may do Coricidin HBP, plain Mucinex, Flonase, humidifiers, saline sinus rinses.

## 2024-04-27 NOTE — ED Triage Notes (Signed)
 Pt reports cough, congestion, wheezing, onset Friday evening  has progressed.

## 2024-04-27 NOTE — Discharge Instructions (Signed)

## 2024-04-27 NOTE — Interval H&P Note (Signed)
 History and Physical Interval Note:  04/27/2024 7:19 AM  Aaron Rosario  has presented today for surgery, with the diagnosis of chest pain.  The various methods of treatment have been discussed with the patient and family. After consideration of risks, benefits and other options for treatment, the patient has consented to  Procedure(s): LEFT HEART CATH AND CORONARY ANGIOGRAPHY (N/A) as a surgical intervention.  The patient's history has been reviewed, patient examined, no change in status, stable for surgery.  I have reviewed the patient's chart and labs.  Questions were answered to the patient's satisfaction.    Cath Lab Visit (complete for each Cath Lab visit)  Clinical Evaluation Leading to the Procedure:   ACS: No.  Non-ACS:    Anginal Classification: CCS III  Anti-ischemic medical therapy: Maximal Therapy (2 or more classes of medications)  Non-Invasive Test Results: No non-invasive testing performed  Prior CABG: No previous CABG  Rachana Malesky

## 2024-04-27 NOTE — Progress Notes (Signed)
 TR band removed, site has scant old drainage, no bleeding noted. Gauze band put in place remains clean dry and intact. PT ambulated to and from the bathroom, with no complications

## 2024-04-27 NOTE — ED Provider Notes (Signed)
 [facility]    CSN: [id number] Arrival date & time: 04/27/24  1824      History   Chief Complaint No chief complaint on file.   HPI Aaron Rosario is a 58 y.o. male.   Patient presenting today with 4-day history of cough, wheezing, congestion, fatigue, sore throat.  Denies chest pain, shortness of breath, abdominal pain, vomiting, diarrhea.  Denies chest pain, shortness of breath, abdominal pain, vomiting, diarrhea.  So far trying over-the-counter cough syrups with minimal relief.  No known history of chronic pulmonary disease.  Also has a painful area to the mid back, no known injury.  Not tried anything for symptoms.    Past Medical History:  Diagnosis Date   Anxiety    Hx of cardiac cath    x2, no stents    Hypersomnia, persistent    Panic attacks     Patient Active Problem List   Diagnosis Date Noted   Hyperlipidemia 05/26/2021   Morbid obesity (HCC) 05/26/2021   Obstructive sleep apnea 05/26/2021   NSTEMI (non-ST elevated myocardial infarction) (HCC) 03/05/2021   Hypersomnia, persistent     Past Surgical History:  Procedure Laterality Date   CHOLECYSTECTOMY     CORONARY PRESSURE/FFR STUDY N/A 04/27/2024   Procedure: CORONARY PRESSURE/FFR STUDY;  Surgeon: Mady Bruckner, MD;  Location: [facility] INVASIVE CV LAB;  Service: Cardiovascular;  Laterality: N/A;   CORONARY STENT INTERVENTION N/A 03/06/2021   Procedure: CORONARY STENT INTERVENTION;  Surgeon: Wonda Sharper, MD;  Location: [facility] INVASIVE CV LAB;  Service: Cardiovascular;  Laterality: N/A;   LEFT HEART CATH AND CORONARY ANGIOGRAPHY N/A 03/06/2021   Procedure: LEFT HEART CATH AND CORONARY ANGIOGRAPHY;  Surgeon: Wonda Sharper, MD;  Location: [facility] INVASIVE CV LAB;  Service: Cardiovascular;  Laterality: N/A;   LEFT HEART CATH AND CORONARY ANGIOGRAPHY N/A 04/27/2024   Procedure: LEFT HEART CATH AND CORONARY ANGIOGRAPHY;  Surgeon: Mady Bruckner, MD;  Location: [facility] INVASIVE CV LAB;  Service: Cardiovascular;   Laterality: N/A;       Home Medications    Prior to Admission medications   Medication Sig Start Date End Date Taking? Authorizing Provider  albuterol  (VENTOLIN  HFA) 108 (90 Base) MCG/ACT inhaler Inhale 2 puffs into the lungs every 4 (four) hours as needed. 04/27/24  Yes Stuart Vernell Norris, PA-C  chlorhexidine  (HIBICLENS ) 4 % external liquid Apply topically daily as needed. 04/27/24  Yes Stuart Vernell Norris, PA-C  mupirocin  ointment (BACTROBAN ) 2 % Apply 1 Application topically 2 (two) times daily. 04/27/24  Yes Stuart Vernell Norris, PA-C  promethazine -dextromethorphan (PROMETHAZINE -DM) 6.25-15 MG/5ML syrup Take 5 mLs by mouth 4 (four) times daily as needed. 04/27/24  Yes Stuart Vernell Norris, PA-C  amLODipine  (NORVASC ) 2.5 MG tablet Take 1 tablet (2.5 mg total) by mouth daily. Patient not taking: Reported on 04/16/2024 04/15/24 07/14/24  Vicci Rollo SAUNDERS, PA-C  aspirin  81 MG EC tablet Take 1 tablet (81 mg total) by mouth daily. Swallow whole. 03/08/21   Bhagat, Aleene, PA  atorvastatin  (LIPITOR ) 80 MG tablet Take 1 tablet (80 mg total) by mouth daily. 03/08/21   Bhagat, Aleene, PA  busPIRone  (BUSPAR ) 15 MG tablet Take 15 mg by mouth 2 (two) times daily.    [provider]  Cholecalciferol (VITAMIN D3) 50 MCG (2000 UT) capsule Take 2,000 Units by mouth daily.    [provider]  clonazePAM  (KLONOPIN ) 0.5 MG tablet Take 0.5 mg by mouth 2 (two) times daily.    [provider]  ezetimibe  (ZETIA ) 10 MG tablet  Take 1 tablet (10 mg total) by mouth daily. Patient taking differently: Take 5 mg by mouth in the morning and at bedtime. 05/26/21 04/27/24  Court Dorn PARAS, MD  metoprolol  tartrate (LOPRESSOR ) 50 MG tablet Take 25 mg by mouth 2 (two) times daily.    [provider]  nitroGLYCERIN  (NITROSTAT ) 0.4 MG SL tablet Place 1 tablet (0.4 mg total) under the tongue every 5 (five) minutes x 3 doses as needed for chest pain. 03/07/21   Bhagat, Bhavinkumar, PA   omeprazole (PRILOSEC) 40 MG capsule Take 40 mg by mouth daily.    [provider]  PARoxetine  (PAXIL ) 40 MG tablet Take 40 mg by mouth daily. 02/01/18   [provider]  vitamin B-12 (CYANOCOBALAMIN) 500 MCG tablet Take 500 mcg by mouth daily.    [provider]    Family History History reviewed. No pertinent family history.  Social History Social History   Tobacco Use   Smoking status: Never   Smokeless tobacco: Never  Vaping Use   Vaping status: Never Used  Substance Use Topics   Alcohol use: No   Drug use: No     Allergies   Augmentin  [amoxicillin -pot clavulanate] and Erythromycin   Review of Systems Review of Systems Per HPI  Physical Exam Triage Vital Signs ED Triage Vitals  Encounter Vitals Group     BP 04/27/24 1853 110/74     Girls Systolic BP Percentile --      Girls Diastolic BP Percentile --      Boys Systolic BP Percentile --      Boys Diastolic BP Percentile --      Pulse Rate 04/27/24 1853 61     Resp 04/27/24 1853 18     Temp 04/27/24 1853 98.7 F (37.1 C)     Temp Source 04/27/24 1853 Oral     SpO2 04/27/24 1853 93 %     Weight --      Height --      Head Circumference --      Peak Flow --      Pain Score 04/27/24 1856 0     Pain Loc --      Pain Education --      Exclude from Growth Chart --    No data found.  Updated Vital Signs BP 110/74 (BP Location: Right Arm)   Pulse 61   Temp 98.7 F (37.1 C) (Oral)   Resp 18   SpO2 93%   Visual Acuity Right Eye Distance:   Left Eye Distance:   Bilateral Distance:    Right Eye Near:   Left Eye Near:    Bilateral Near:     Physical Exam Vitals and nursing note reviewed.  Constitutional:      Appearance: He is well-developed.  HENT:     Head: Atraumatic.     Right Ear: External ear normal.     Left Ear: External ear normal.     Nose: Rhinorrhea present.     Mouth/Throat:     Pharynx: Posterior oropharyngeal erythema present. No oropharyngeal exudate.   Eyes:     Conjunctiva/sclera: Conjunctivae normal.     Pupils: Pupils are equal, round, and reactive to light.  Cardiovascular:     Rate and Rhythm: Normal rate and regular rhythm.  Pulmonary:     Effort: Pulmonary effort is normal. No respiratory distress.     Breath sounds: No wheezing or rales.  Musculoskeletal:        General: Normal  range of motion.     Cervical back: Normal range of motion and neck supple.  Lymphadenopathy:     Cervical: No cervical adenopathy.  Skin:    General: Skin is warm and dry.     Comments: Erythematous inflamed skin tag to the mid back  Neurological:     Mental Status: He is alert and oriented to person, place, and time.  Psychiatric:        Behavior: Behavior normal.      UC Treatments / Results  Labs (all labs ordered are listed, but only abnormal results are displayed) Labs Reviewed - No data to display  EKG   Radiology CARDIAC CATHETERIZATION Result Date: 04/27/2024 Conclusions: Mild-moderate, non-obstructive coronary artery disease with 60% mid LAD stenosis that is not hemodynamically significant (RFR = 0.91), as well as 20-30% proximal LCx, proximal RCA, and distal RCA lesions. Widely patent mid and distal LCx stents. Normal left ventricular systolic function (LVEF 55-65%) with normal filling pressure (LVEDP 10 mmHg). Recommendations: Continue medical therapy and risk factor modification to prevent progression of coronary artery disease. Lonni Hanson, MD [facility]   Procedures Procedures (including critical care time)  Medications Ordered in UC Medications  bacitracin  ointment 1 Application (1 Application Topical Given 04/27/24 1931)    Initial Impression / Assessment and Plan / UC Course  I have reviewed the triage vital signs and the nursing notes.  Pertinent labs & imaging results that were available during my care of the patient were reviewed by me and considered in my medical decision making (see chart for details).      Consistent with viral bronchitis.  Treat with albuterol , Phenergan  DM, supportive over-the-counter medications and home care.  Treat inflamed skin tag with Hibiclens , mupirocin , nonstick dressings.  Return for worsening symptoms.  Final Clinical Impressions(s) / UC Diagnoses   Final diagnoses:  Viral URI with cough  Wheezing  Inflamed skin tag     Discharge Instructions      Clean the inflamed skin tag on your back once to twice daily with the Hibiclens  and apply the mupirocin  ointment and a bandage.  Follow-up for worsening or unresolving symptoms.  For your upper respiratory symptoms, I have prescribed cough syrup, albuterol  inhaler and you may do Coricidin HBP, plain Mucinex, Flonase, humidifiers, saline sinus rinses.    ED Prescriptions     Medication Sig Dispense Auth. Provider   chlorhexidine  (HIBICLENS ) 4 % external liquid Apply topically daily as needed. 236 mL Stuart Vernell Norris, PA-C   mupirocin  ointment (BACTROBAN ) 2 % Apply 1 Application topically 2 (two) times daily. 60 g Stuart Vernell Norris, PA-C   albuterol  (VENTOLIN  HFA) 108 (90 Base) MCG/ACT inhaler Inhale 2 puffs into the lungs every 4 (four) hours as needed. 18 g Stuart Vernell Norris, PA-C   promethazine -dextromethorphan (PROMETHAZINE -DM) 6.25-15 MG/5ML syrup Take 5 mLs by mouth 4 (four) times daily as needed. 100 mL Stuart Vernell Norris, [place]      PDMP not reviewed this encounter.   Stuart Vernell Norris, [place] 04/27/24 1941

## 2024-06-01 NOTE — Addendum Note (Signed)
 Addended by: Davon Folta W on: 06/01/2024 02:24 PM   Modules accepted: Orders

## 2024-06-01 NOTE — Telephone Encounter (Signed)
 Spoke with pt, aware he still needs an echo, Order placed for scheduling.

## 2024-07-03 ENCOUNTER — Ambulatory Visit: Payer: Self-pay | Admitting: Cardiology

## 2024-07-03 ENCOUNTER — Ambulatory Visit (HOSPITAL_COMMUNITY)
Admission: RE | Admit: 2024-07-03 | Discharge: 2024-07-03 | Disposition: A | Discharge status: Home / Self Care | Source: Ambulatory Visit | Attending: Cardiology | Admitting: Cardiology

## 2024-07-03 DIAGNOSIS — I517 Cardiomegaly: Secondary | ICD-10-CM

## 2024-07-03 DIAGNOSIS — R002 Palpitations: Secondary | ICD-10-CM | POA: Insufficient documentation

## 2024-07-03 LAB — ECHOCARDIOGRAM COMPLETE
Area-P 1/2: 3.36 cm2
S' Lateral: 2.8 cm
